# Patient Record
Sex: Male | Born: 1960 | Race: Black or African American | Hispanic: No | Marital: Single | Smoking: Never smoker
Health system: Southern US, Community
[De-identification: ages and names within clinical notes are randomized; demographics above are authoritative.]

## PROBLEM LIST (undated history)

## (undated) DIAGNOSIS — K219 Gastro-esophageal reflux disease without esophagitis: Secondary | ICD-10-CM

## (undated) DIAGNOSIS — I10 Essential (primary) hypertension: Secondary | ICD-10-CM

## (undated) DIAGNOSIS — E785 Hyperlipidemia, unspecified: Secondary | ICD-10-CM

## (undated) DIAGNOSIS — M1990 Unspecified osteoarthritis, unspecified site: Secondary | ICD-10-CM

## (undated) DIAGNOSIS — G4730 Sleep apnea, unspecified: Secondary | ICD-10-CM

## (undated) HISTORY — PX: DIALYSIS/PERMA CATHETER INSERTION: CATH118288

---

## 2002-05-30 ENCOUNTER — Inpatient Hospital Stay (HOSPITAL_COMMUNITY): Admission: EM | Admit: 2002-05-30 | Discharge: 2002-06-02 | Payer: Self-pay | Admitting: Psychiatry

## 2009-07-29 ENCOUNTER — Emergency Department (HOSPITAL_BASED_OUTPATIENT_CLINIC_OR_DEPARTMENT_OTHER): Admission: EM | Admit: 2009-07-29 | Discharge: 2009-07-29 | Payer: Self-pay | Admitting: Emergency Medicine

## 2010-01-13 ENCOUNTER — Emergency Department (HOSPITAL_BASED_OUTPATIENT_CLINIC_OR_DEPARTMENT_OTHER): Admission: EM | Admit: 2010-01-13 | Discharge: 2010-01-13 | Payer: Self-pay | Admitting: Emergency Medicine

## 2010-02-03 ENCOUNTER — Emergency Department (HOSPITAL_BASED_OUTPATIENT_CLINIC_OR_DEPARTMENT_OTHER): Admission: EM | Admit: 2010-02-03 | Discharge: 2010-02-04 | Payer: Self-pay | Admitting: Emergency Medicine

## 2010-03-18 ENCOUNTER — Emergency Department (HOSPITAL_BASED_OUTPATIENT_CLINIC_OR_DEPARTMENT_OTHER): Admission: EM | Admit: 2010-03-18 | Discharge: 2010-03-18 | Payer: Self-pay | Admitting: Emergency Medicine

## 2010-04-27 ENCOUNTER — Emergency Department (HOSPITAL_BASED_OUTPATIENT_CLINIC_OR_DEPARTMENT_OTHER): Admission: EM | Admit: 2010-04-27 | Discharge: 2010-04-27 | Payer: Self-pay | Admitting: Emergency Medicine

## 2010-05-08 ENCOUNTER — Emergency Department (HOSPITAL_BASED_OUTPATIENT_CLINIC_OR_DEPARTMENT_OTHER): Admission: EM | Admit: 2010-05-08 | Discharge: 2010-05-08 | Payer: Self-pay | Admitting: Emergency Medicine

## 2010-05-22 ENCOUNTER — Emergency Department (HOSPITAL_BASED_OUTPATIENT_CLINIC_OR_DEPARTMENT_OTHER): Admission: EM | Admit: 2010-05-22 | Discharge: 2010-05-22 | Payer: Self-pay | Admitting: Emergency Medicine

## 2010-06-15 ENCOUNTER — Emergency Department (HOSPITAL_BASED_OUTPATIENT_CLINIC_OR_DEPARTMENT_OTHER): Admission: EM | Admit: 2010-06-15 | Discharge: 2010-06-15 | Payer: Self-pay | Admitting: Emergency Medicine

## 2010-06-29 ENCOUNTER — Emergency Department (HOSPITAL_BASED_OUTPATIENT_CLINIC_OR_DEPARTMENT_OTHER): Admission: EM | Admit: 2010-06-29 | Discharge: 2010-06-29 | Payer: Self-pay | Admitting: Emergency Medicine

## 2010-07-24 ENCOUNTER — Emergency Department (HOSPITAL_BASED_OUTPATIENT_CLINIC_OR_DEPARTMENT_OTHER): Admission: EM | Admit: 2010-07-24 | Discharge: 2010-07-24 | Payer: Self-pay | Admitting: Emergency Medicine

## 2010-08-11 ENCOUNTER — Emergency Department (HOSPITAL_BASED_OUTPATIENT_CLINIC_OR_DEPARTMENT_OTHER): Admission: EM | Admit: 2010-08-11 | Discharge: 2010-08-11 | Payer: Self-pay | Admitting: Emergency Medicine

## 2010-08-31 ENCOUNTER — Emergency Department (HOSPITAL_BASED_OUTPATIENT_CLINIC_OR_DEPARTMENT_OTHER)
Admission: EM | Admit: 2010-08-31 | Discharge: 2010-08-31 | Payer: Self-pay | Source: Home / Self Care | Admitting: Emergency Medicine

## 2010-09-29 ENCOUNTER — Emergency Department (HOSPITAL_BASED_OUTPATIENT_CLINIC_OR_DEPARTMENT_OTHER)
Admission: EM | Admit: 2010-09-29 | Discharge: 2010-09-29 | Payer: Self-pay | Source: Home / Self Care | Admitting: Emergency Medicine

## 2010-10-21 ENCOUNTER — Ambulatory Visit (HOSPITAL_BASED_OUTPATIENT_CLINIC_OR_DEPARTMENT_OTHER): Admission: RE | Admit: 2010-10-21 | Payer: Self-pay | Source: Home / Self Care | Admitting: Family Medicine

## 2010-11-10 ENCOUNTER — Ambulatory Visit (HOSPITAL_BASED_OUTPATIENT_CLINIC_OR_DEPARTMENT_OTHER): Admission: RE | Admit: 2010-11-10 | Payer: Self-pay | Source: Home / Self Care

## 2010-12-09 ENCOUNTER — Ambulatory Visit (HOSPITAL_BASED_OUTPATIENT_CLINIC_OR_DEPARTMENT_OTHER): Payer: Self-pay

## 2010-12-21 LAB — CBC
HCT: 38.5 % — ABNORMAL LOW (ref 39.0–52.0)
Hemoglobin: 13 g/dL (ref 13.0–17.0)
MCH: 29.5 pg (ref 26.0–34.0)
MCHC: 33.8 g/dL (ref 30.0–36.0)
MCV: 87.5 fL (ref 78.0–100.0)
Platelets: 148 10*3/uL — ABNORMAL LOW (ref 150–400)
RBC: 4.4 MIL/uL (ref 4.22–5.81)
RDW: 12.4 % (ref 11.5–15.5)
WBC: 10.2 10*3/uL (ref 4.0–10.5)

## 2010-12-21 LAB — DIFFERENTIAL
Basophils Relative: 0 % (ref 0–1)
Eosinophils Relative: 2 % (ref 0–5)
Lymphocytes Relative: 11 % — ABNORMAL LOW (ref 12–46)
Monocytes Absolute: 0.5 10*3/uL (ref 0.1–1.0)
Monocytes Relative: 5 % (ref 3–12)
Neutro Abs: 8.4 10*3/uL — ABNORMAL HIGH (ref 1.7–7.7)

## 2010-12-21 LAB — BASIC METABOLIC PANEL
Chloride: 108 mEq/L (ref 96–112)
GFR calc non Af Amer: 43 mL/min — ABNORMAL LOW (ref >60)
Glucose, Bld: 147 mg/dL — ABNORMAL HIGH (ref 70–99)
Potassium: 3.8 mEq/L (ref 3.5–5.1)
Sodium: 142 mEq/L (ref 135–145)

## 2010-12-22 LAB — CBC
Hemoglobin: 13.9 g/dL (ref 13.0–17.0)
MCH: 31.5 pg (ref 26.0–34.0)
MCV: 90.8 fL (ref 78.0–100.0)
RBC: 4.41 MIL/uL (ref 4.22–5.81)

## 2010-12-22 LAB — DIFFERENTIAL
Eosinophils Absolute: 0.1 10*3/uL (ref 0.0–0.7)
Eosinophils Relative: 2 % (ref 0–5)
Lymphs Abs: 1.6 10*3/uL (ref 0.7–4.0)
Monocytes Absolute: 0.6 10*3/uL (ref 0.1–1.0)
Monocytes Relative: 6 % (ref 3–12)

## 2010-12-22 LAB — BASIC METABOLIC PANEL
CO2: 23 mEq/L (ref 19–32)
Chloride: 107 mEq/L (ref 96–112)
GFR calc Af Amer: 60 mL/min — ABNORMAL LOW (ref >60)
Sodium: 142 mEq/L (ref 135–145)

## 2010-12-24 LAB — GLUCOSE, CAPILLARY: Glucose-Capillary: 159 mg/dL — ABNORMAL HIGH (ref 70–99)

## 2010-12-24 LAB — CBC
Hemoglobin: 14.6 g/dL (ref 13.0–17.0)
MCH: 32.4 pg (ref 26.0–34.0)
MCHC: 33.5 g/dL (ref 30.0–36.0)
Platelets: 110 10*3/uL — ABNORMAL LOW (ref 150–400)

## 2010-12-24 LAB — BASIC METABOLIC PANEL
CO2: 29 mEq/L (ref 19–32)
Calcium: 8.9 mg/dL (ref 8.4–10.5)
Creatinine, Ser: 1.4 mg/dL (ref 0.4–1.5)
Glucose, Bld: 168 mg/dL — ABNORMAL HIGH (ref 70–99)

## 2011-02-26 NOTE — H&P (Signed)
NAME:  Jordan Williamson, Jordan Williamson                          ACCOUNT NO.:  [id number]   MEDICAL RECORD NO.:  [id number]                   PATIENT TYPE:  IPS   LOCATION:  0506                                 FACILITY:   PHYSICIAN:  Carlton Adam, MD                     DATE OF BIRTH:  Mar 04, 1961   DATE OF ADMISSION:  05/30/2002  DATE OF DISCHARGE:                         PSYCHIATRIC ADMISSION ASSESSMENT   IDENTIFYING INFORMATION:  Jordan Williamson 50 year old married African-American male,  voluntarily admitted on May 30, 2002.   HISTORY OF PRESENT ILLNESS:  The patient presents with Jordan Williamson history of alcohol  and cocaine abuse, drinking and doing drugs for the past 9 months.  He  states it started after he lost his job and being influenced by others.  He  reports he has been getting depressed, with suicidal thought, no specific  plan.  Having financial difficulties, owing people Jordan Williamson lot of money and seeing  his money going down the drain.  Reports decreased sleep with Jordan Williamson history of  sleep apnea.  Has decreased appetite, lost 18 pounds.  He denies any current  suicidal ideation, experiencing positive homicidal ideation towards those  people that got him fired from his last job.  He denies any withdrawal  symptoms at present.   PAST PSYCHIATRIC HISTORY:  First admission to [facility], no  other admission, no history of detox.   SOCIAL HISTORY:  He is Jordan Williamson 50 year old married African-American male.  He has  been married for 2 years, first marriage, 2 children ages 30 and 53.  He  lives with his wife and children.  He works in [employer].  No legal charges.  He did have Jordan Williamson DUI in 1995.   FAMILY HISTORY:  Unknown.   ALCOHOL DRUG HISTORY:  Nonsmoker.  Has been drinking every day up to 2  quarts of beer and 2 pints of wine for the past 8-9 months.  He states his  drinking has been escalating.  No history of blackouts or seizures.  Last  drink was on May 30, 2002.  His first drink was  at age 47.  Does not  drink at work.  He also has been using crack cocaine for the past 8 months.   PAST MEDICAL HISTORY:  Primary care Donnalynn Wheeless is Dr. Lajoyce Corners in [facility]  Medical problems are hypertension, sleep apnea, gout and GERD.   MEDICATIONS:  Lotensin 20 mg every day and Prilosec 20 mg every day.   DRUG ALLERGIES:  No known allergies.   PHYSICAL EXAMINATION:  VITAL SIGNS:  97.1, 73 heart rate, respiratory rate is 22.  Blood pressure  is 176/96.  He is 5 feet 9 inches tall.  He is 232 pounds.  GENERAL APPEARANCE:  The patient  is Jordan Williamson 50 year old  African-American male in  no acute distress.  Well developed.  Patient is well groomed.  HEAD:  Atraumatic, normocephalic.  Can raise her eyebrows.  Pupils are equal  and reactive.  His funduscopic exam is within normal limits.  External ear  canals are patent.  Hearing is appropriate to conversation.  Nostrils are  patent bilaterally.  No sinus tenderness. Turbinates are normal.  Mucosa is  moist with good dentition.  No lesions were seen.  Gingival is normal.  Tongue protrudes midline without tremor.  Can clench his teeth and puff out  his cheeks.  Uvula is midline.  No pharyngeal exudate.  Gag reflex is  intact.  NECK:  Supple, no JVD, negative lymphadenopathy.  Thyroid is nonpalpable and  nontender.  Trachea is midline.  CHEST:  Clear to auscultation.  No adventitious sounds.  Thorax is  symmetrical with good expansion.  HEART:  Regular rate and rhythm, without murmurs, gallops or rubs.  Carotid  pulses are equal and adequate.  Pedal pulses are equal and adequate.  No  edema or varicosities noted.  ABDOMEN:  Soft, nontender abdomen.  Normoactive bowel sounds.  No CVA  tenderness.  MUSCULOSKELETAL:  No joint swelling or deformity.  Good range of motion.  Muscle strength and tone is equal bilaterally.  No signs of injury.  SKIN:  Warm and dry with good turgor.  Nail beds are pink with good  capillary refill.  No clubbing.  No rashes  or lacerations.  NEUROLOGIC:  Oriented x3.  Cranial nerves are grossly intact.  Deep tendon  reflexes are 4+, equal and adequate.  Good grip strength bilaterally.  No  involuntary movements.  Gait is normal.  Cerebellar function intact, with  finger to finger.  Romberg is negative.   LABORATORY DATA:  Alcohol level was 23.  CBC is within normal limits.  CMET:  Serum  blood sugar mildly elevated at 131.   MENTAL STATUS EXAM:  He is an alert, overweight, cooperative male, casually  dressed, polite, with good eye contact.  Speech is clear.  Mood is  depressed, affect is flat.  Thought processes are coherent.  No suicidal or  homicidal ideation.  No auditory or visual hallucinations.  Cognitive  function intact.  Memory is fair, judgment is fair, insight is fair, poor  impulse control.   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  2. Alcohol abuse rule out dependence.  3. Cocaine abuse rule out dependence.   AXIS II:  Deferred.   AXIS III:  Hypertension, hyperlipidemia.   AXIS IV:  Problems with primary support group, other psychosocial problems  related to alcohol and drug use.   AXIS V:  Current is 30, this past year 36.   PLAN:  Voluntary admission for depression, suicidal ideation, alcohol and  cocaine abuse.  Contract for safety, check every 15 minutes.  Phenobarbital  protocol will be initiated to detox safely.  Encourage fluids.  Antidepressants were discussed.  The patient feels he does not need them at  this time.  We will continue to monitor depressive  symptoms during patient's hospitalization.  The patient to attend groups,  follow up with mental health and AA meetings to increase coping skills and  to prevent relapse.   TENTATIVE LENGTH OF CARE:  Three to five days.      Redgie Grayer, NP                         Carlton Adam, MD    JO/MEDQ  D:  06/01/2002  T:  06/02/2002  Job:  [id number]

## 2011-02-26 NOTE — Discharge Summary (Signed)
NAME:  Jordan Williamson, Jordan Williamson                          ACCOUNT NO.:  [id number]   MEDICAL RECORD NO.:  [id number]                   PATIENT TYPE:  IPS   LOCATION:  0506                                 FACILITY:  [facility]   PHYSICIAN:  Carlton Adam, M.D.                   DATE OF BIRTH:  11/12/1960   DATE OF ADMISSION:  05/30/2002  DATE OF DISCHARGE:  06/02/2002                                 DISCHARGE SUMMARY   CHIEF COMPLAINT AND PRESENT ILLNESS:  This was the first admission to [facility] for this 50 year old African-American male  voluntarily admitted.  History of alcohol and cocaine, drinking and doing  drugs for the past nine months.  Started after he lost his job and being  influenced by others.  Has been getting depressed, suicidal thoughts, no  specific plan.  Having financial difficulties, owing people a lot of money.  Sees his money going down the drain.  She reports decreased sleep, history  of sleep apnea.  Has decreased appetite.  Denies any current suicidal  ideation, experiencing positive homicidal ideation towards those people that  got him fired from his last job.  No withdrawal symptoms.   PAST PSYCHIATRIC HISTORY:  First time at [facility].  No  previous detox.   ALCOHOL/DRUG HISTORY:  Drinking every day up to two quarts of beer and two  pints of wine for the past 8-9 months.  His drinking has been escalating.  No history of blackouts or seizures.  Last drink was May 30, 2002.  First  drink was at age 73.   PAST MEDICAL HISTORY:  Hypertension, sleep apnea, gout, gastroesophageal  reflux.   MEDICATIONS:  Lotensin 20 mg every day, Prilosec 20 mg every day.   PHYSICAL EXAMINATION:  Performed and failed to show any acute findings.   MENTAL STATUS EXAM:  Alert, overweight, cooperative male, casually dressed,  polite.  Good eye contact.  Speech is clear.  Mood is depressed.  Affect is  flat.  Thought processes are coherent.  No suicidal or  homicidal ideation.  No auditory or visual hallucinations.  Cognition well-preserved.   ADMISSION DIAGNOSES:   AXIS I:  1. Alcohol and cocaine dependence.  2. Depressive disorder not otherwise specified.   AXIS II:  No diagnosis.   AXIS III:  1. Hypertension.  2. Hyperlipidemia.   AXIS IV:  Moderate.   AXIS V:  Global Assessment of Functioning upon admission 30; highest Global  Assessment of Functioning in the last year 32.   LABORATORY DATA:  CBC was within normal limits.  Blood chemistries were  within normal limits.  Thyroid profile was within normal limits.   HOSPITAL COURSE:  He was admitted and started intensive individual and group  psychotherapy.  He was detoxified initially using phenobarbital but this was  discontinued due to extreme sedation.  He was continued on the Prilosec  and  the Lotensin.  He settled down.  He was in full contact with reality.  No  suicidal ideation.  No homicidal ideation.  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  He could not afford to stay.  There was  contact with the family.  He was willing to pursue further outpatient  treatment and family felt that he was safe to do that.  No suicidal  ideation.  No homicidal ideation.  Fully detoxed.   DISCHARGE DIAGNOSES:   AXIS I:  Alcohol and cocaine dependence.   AXIS II:  No diagnosis.   AXIS III:  1. Hypertension.  2. Hyperlipidemia.   AXIS IV:  Moderate.   AXIS V:  Global Assessment of Functioning upon discharge 55-60.   DISCHARGE MEDICATIONS:  1. Protonix 40 mg daily.  2. Lotensin 20 mg daily.  3. Trazodone for sleep.   FOLLOW UP:  Will come to [facility] for further treatment.                                                Carlton Adam, M.D.    IL/MEDQ  D:  07/04/2002  T:  07/06/2002  Job:  [id number]

## 2011-06-04 ENCOUNTER — Encounter: Payer: Self-pay | Admitting: *Deleted

## 2011-06-04 ENCOUNTER — Emergency Department (HOSPITAL_BASED_OUTPATIENT_CLINIC_OR_DEPARTMENT_OTHER)
Admission: EM | Admit: 2011-06-04 | Discharge: 2011-06-04 | Disposition: A | Discharge status: Home / Self Care | Payer: Self-pay | Attending: Emergency Medicine | Admitting: Emergency Medicine

## 2011-06-04 DIAGNOSIS — E1149 Type 2 diabetes mellitus with other diabetic neurological complication: Secondary | ICD-10-CM | POA: Insufficient documentation

## 2011-06-04 DIAGNOSIS — E785 Hyperlipidemia, unspecified: Secondary | ICD-10-CM | POA: Insufficient documentation

## 2011-06-04 DIAGNOSIS — R21 Rash and other nonspecific skin eruption: Secondary | ICD-10-CM | POA: Insufficient documentation

## 2011-06-04 DIAGNOSIS — E1142 Type 2 diabetes mellitus with diabetic polyneuropathy: Secondary | ICD-10-CM | POA: Insufficient documentation

## 2011-06-04 DIAGNOSIS — I10 Essential (primary) hypertension: Secondary | ICD-10-CM | POA: Insufficient documentation

## 2011-06-04 DIAGNOSIS — G4730 Sleep apnea, unspecified: Secondary | ICD-10-CM | POA: Insufficient documentation

## 2011-06-04 DIAGNOSIS — Z79899 Other long term (current) drug therapy: Secondary | ICD-10-CM | POA: Insufficient documentation

## 2011-06-04 DIAGNOSIS — E1140 Type 2 diabetes mellitus with diabetic neuropathy, unspecified: Secondary | ICD-10-CM

## 2011-06-04 HISTORY — DX: Sleep apnea, unspecified: G47.30

## 2011-06-04 HISTORY — DX: Hyperlipidemia, unspecified: E78.5

## 2011-06-04 HISTORY — DX: Essential (primary) hypertension: I10

## 2011-06-04 LAB — GLUCOSE, CAPILLARY: Glucose-Capillary: 235 mg/dL — ABNORMAL HIGH (ref 70–99)

## 2011-06-04 MED ORDER — HYDROMORPHONE HCL 2 MG/ML IJ SOLN
2.0000 mg | Freq: Once | INTRAMUSCULAR | Status: AC
  Administered 2011-06-04: 2 mg via INTRAMUSCULAR
  Filled 2011-06-04: qty 1

## 2011-06-04 MED ORDER — HYDROCODONE-ACETAMINOPHEN 5-500 MG PO TABS: 1.0000 | ORAL_TABLET | Freq: Four times a day (QID) | ORAL | Status: AC | PRN

## 2011-06-04 MED ORDER — GABAPENTIN 300 MG PO CAPS: 600.0000 mg | ORAL_CAPSULE | Freq: Three times a day (TID) | ORAL | Status: DC

## 2011-06-04 NOTE — ED Notes (Signed)
Pt c/o bilat feet swelling and rash x3-4 days. Pt sts same itches and burns.

## 2011-06-04 NOTE — ED Provider Notes (Signed)
History     CSN: [id number] Arrival date & time: 06/04/2011  3:42 PM  Chief Complaint  Patient presents with  . Foot Swelling   HPI Comments: Complains of bilateral foot swelling, burning pain, rash for the past 5-6 days.  States sx have been present for longer but has gotten worse in past 5-6 days.  No known injury.  Pain is described as burning and needle sensations to the bottom of both feet.  Has hx of Dm.  Has been taking neurontin with slight improvement.  On metformin and does not take CBG at home.  Has been eating larger amount of sweets.  Rash is non-blanching red rash to medial aspect foot bilaterally and has not had any associated vesicles.  Denies cp, sob, palps, abd pain, HA, weakness.  States that he is not on his feet a large portion of the day  The history is provided by the patient. No language interpreter was used.    Past Medical History  Diagnosis Date  . Hypertension   . Diabetes mellitus   . Sleep apnea   . Gout   . Hyperlipidemia     History reviewed. No pertinent past surgical history.  No family history on file.  History  Substance Use Topics  . Smoking status: Never Smoker   . Smokeless tobacco: Not on file  . Alcohol Use: Yes      Review of Systems  Constitutional: Negative for fever, activity change, appetite change and fatigue.  HENT: Negative for congestion, sore throat, neck pain and neck stiffness.   Eyes: Negative for pain, redness and visual disturbance.  Respiratory: Negative for cough, chest tightness and shortness of breath.   Cardiovascular: Negative for chest pain and palpitations.  Gastrointestinal: Negative for nausea, vomiting, abdominal pain and diarrhea.  Genitourinary: Negative for dysuria, urgency, frequency and flank pain.  Musculoskeletal: Negative for back pain and gait problem.  Skin: Positive for rash. Negative for wound.  Neurological: Negative for dizziness, weakness, light-headedness, numbness and headaches.  All  other systems reviewed and are negative.    Physical Exam  BP 162/107  Pulse 82  Temp(Src) 97.5 F (36.4 C) (Oral)  Resp 20  SpO2 99%  Physical Exam  Nursing note and vitals reviewed. Constitutional: He is oriented to person, place, and time. He appears well-developed and well-nourished. No distress.  HENT:  Head: Normocephalic and atraumatic.  Mouth/Throat: Oropharynx is clear and moist.  Eyes: Conjunctivae and EOM are normal. Pupils are equal, round, and reactive to light.  Neck: Normal range of motion. Neck supple.  Cardiovascular: Normal rate, regular rhythm, normal heart sounds and intact distal pulses.  Exam reveals no gallop and no friction rub.   No murmur heard. Pulmonary/Chest: Effort normal and breath sounds normal. No respiratory distress. He exhibits no tenderness.  Abdominal: Soft. Bowel sounds are normal. There is no tenderness.  Musculoskeletal: Normal range of motion. He exhibits tenderness (plantar surface feet bilaterally - exacerbated by palpation and present over entire plantar surface bilaterally).  Lymphadenopathy:    He has no cervical adenopathy.  Neurological: He is alert and oriented to person, place, and time. No cranial nerve deficit.  Skin: Skin is warm and dry.       Non-blanching erythematous lesions to feet bilaterally.  No pain associated.  No lesions to the feet.  No aspects cellulitis    ED Course  Procedures  MDM 1. Diabetic neuropathy 2. Rash  The patient's pain is consistent with a chronic pain he has  been experiencing secondary to his diabetes. The pain is feet consistent with diabetic neuropathy. He has been taking Neurontin twice daily. I recommended that he increase the Neurontin to 600 mg 3 times daily until he sees his primary physician next week. I also prescribed a short course of pain medication. Blood glucose was 230 in the emergency department. He is instructed to continue his metformin as directed. He has increased to sugar and  put in the past few days I have encouraged him to go back to his normal diet. There is no concerns for infection or shingles as a cause of his pain rash. I feel that his rash is more chronic in nature than acute. There may be a petechial component but it is limited only to his feet. He received a shot of Dilaudid emergency department for pain control and will be discharged home with instructions to followup with his primary care physician this week. I prescribed Neurontin and Vicodin for pain.      Trisha Mangle, MD 06/04/11 [phone]

## 2011-06-04 NOTE — ED Notes (Signed)
rx x 2 for vicodin and neurontin given at d/c- pt has called his sister to drive him home

## 2011-06-28 ENCOUNTER — Encounter (HOSPITAL_BASED_OUTPATIENT_CLINIC_OR_DEPARTMENT_OTHER): Payer: Self-pay | Admitting: Family Medicine

## 2011-06-28 ENCOUNTER — Emergency Department (HOSPITAL_BASED_OUTPATIENT_CLINIC_OR_DEPARTMENT_OTHER)
Admission: EM | Admit: 2011-06-28 | Discharge: 2011-06-28 | Disposition: A | Discharge status: Home / Self Care | Payer: Self-pay | Attending: Emergency Medicine | Admitting: Emergency Medicine

## 2011-06-28 DIAGNOSIS — G4730 Sleep apnea, unspecified: Secondary | ICD-10-CM | POA: Insufficient documentation

## 2011-06-28 DIAGNOSIS — R209 Unspecified disturbances of skin sensation: Secondary | ICD-10-CM | POA: Insufficient documentation

## 2011-06-28 DIAGNOSIS — E1149 Type 2 diabetes mellitus with other diabetic neurological complication: Secondary | ICD-10-CM | POA: Insufficient documentation

## 2011-06-28 DIAGNOSIS — I10 Essential (primary) hypertension: Secondary | ICD-10-CM | POA: Insufficient documentation

## 2011-06-28 DIAGNOSIS — E1142 Type 2 diabetes mellitus with diabetic polyneuropathy: Secondary | ICD-10-CM | POA: Insufficient documentation

## 2011-06-28 DIAGNOSIS — Z79899 Other long term (current) drug therapy: Secondary | ICD-10-CM | POA: Insufficient documentation

## 2011-06-28 DIAGNOSIS — E1140 Type 2 diabetes mellitus with diabetic neuropathy, unspecified: Secondary | ICD-10-CM

## 2011-06-28 DIAGNOSIS — E785 Hyperlipidemia, unspecified: Secondary | ICD-10-CM | POA: Insufficient documentation

## 2011-06-28 MED ORDER — OXYCODONE-ACETAMINOPHEN 5-325 MG PO TABS: 2.0000 | ORAL_TABLET | ORAL | Status: AC | PRN

## 2011-06-28 MED ORDER — OXYCODONE-ACETAMINOPHEN 5-325 MG PO TABS
2.0000 | ORAL_TABLET | Freq: Once | ORAL | Status: AC
  Administered 2011-06-28: 2 via ORAL
  Filled 2011-06-28: qty 2

## 2011-06-28 NOTE — ED Provider Notes (Signed)
History     CSN: [id number] Arrival date & time: 06/28/2011  5:49 PM   Chief Complaint  Patient presents with  . Foot Pain     (Include location/radiation/quality/duration/timing/severity/associated sxs/prior treatment) HPI Comments: Pt states that he has burning and tingling on the bottom on his feet:pt states that he has a rash to both feet but that is nothing new:pt states that he has a history of gout:pt states that he was seen here a couple of days ago and he would like another shot like they gave him because that helped with the pain right away  Patient is a 50 y.o. male presenting with lower extremity pain. The history is provided by the patient. No language interpreter was used.  Foot Pain This is a recurrent problem. The current episode started 1 to 4 weeks ago. The problem occurs intermittently. The problem has been unchanged. The symptoms are aggravated by nothing.     Past Medical History  Diagnosis Date  . Hypertension   . Diabetes mellitus   . Sleep apnea   . Gout   . Hyperlipidemia      History reviewed. No pertinent past surgical history.  No family history on file.  History  Substance Use Topics  . Smoking status: Never Smoker   . Smokeless tobacco: Not on file  . Alcohol Use: Yes      Review of Systems  Respiratory: Negative.   Cardiovascular: Negative.   Skin: Positive for pallor.  Neurological:       Tingling and burning in feet bilaterally    Allergies  Review of patient's allergies indicates no known allergies.  Home Medications   Current Outpatient Rx  Name Route Sig Dispense Refill  . ALLOPURINOL 100 MG PO TABS Oral Take 200 mg by mouth daily.      Marland Kitchen AMLODIPINE-OLMESARTAN 10-40 MG PO TABS Oral Take 1 tablet by mouth daily.      . ASPIRIN 81 MG PO TABS Oral Take 81 mg by mouth daily.      Marland Kitchen CLONIDINE HCL 0.3 MG PO TABS Oral Take 0.3 mg by mouth 2 (two) times daily.      . COLCHICINE 0.6 MG PO TABS Oral Take 0.6 mg by mouth daily.       Marland Kitchen ESOMEPRAZOLE MAGNESIUM 40 MG PO CPDR Oral Take 40 mg by mouth daily before breakfast.      . GABAPENTIN 300 MG PO CAPS Oral Take 300 mg by mouth 2 (two) times daily.      Marland Kitchen GABAPENTIN 300 MG PO CAPS Oral Take 2 capsules (600 mg total) by mouth 3 (three) times daily. 30 capsule 0  . METFORMIN HCL 500 MG PO TABS Oral Take 1,000 mg by mouth 2 (two) times daily with a meal.      . NEBIVOLOL HCL 20 MG PO TABS Oral Take 20 mg by mouth daily.      Marland Kitchen PRAVASTATIN SODIUM 10 MG PO TABS Oral Take 10 mg by mouth daily.        Physical Exam    BP 161/108  Pulse 81  Temp(Src) 97.9 F (36.6 C) (Oral)  Resp 18  Ht 5\' 9"  (1.753 m)  Wt 230 lb (104.327 kg)  BMI 33.96 kg/m2  SpO2 97%  Physical Exam  Nursing note and vitals reviewed. Constitutional: He is oriented to person, place, and time. He appears well-developed and well-nourished.  HENT:  Head: Normocephalic.  Cardiovascular: Normal rate and regular rhythm.   Pulmonary/Chest: Effort normal and  breath sounds normal.  Musculoskeletal: Normal range of motion.       Pt has non blanching rash to top of feet bilaterally  Neurological: He is alert and oriented to person, place, and time.  Skin:       Mild redness noted to the plantar aspect of bilateral feet    ED Course  Procedures  Results for orders placed during the hospital encounter of 06/28/11  GLUCOSE, CAPILLARY      Component Value Range   Glucose-Capillary 110 (*) 70 - 99 (mg/dL)   No results found.   No diagnosis found.   MDM Diabetic neuropathy vs gout:will give pt pain medication and encourage the increased dose of neurotin:pt has colchicine at home       Glendell Docker, NP 06/28/11 [id number]

## 2011-06-28 NOTE — ED Notes (Signed)
Pt c/o "feet burning and I have red spots on them". Pt reports h/o gout.

## 2011-06-28 NOTE — ED Provider Notes (Signed)
Medical screening examination/treatment/procedure(s) were performed by non-physician practitioner and as supervising physician I was immediately available for consultation/collaboration.   Ezequiel Essex, MD 06/28/11 [phone]

## 2011-06-29 ENCOUNTER — Encounter (HOSPITAL_BASED_OUTPATIENT_CLINIC_OR_DEPARTMENT_OTHER): Payer: Self-pay | Admitting: *Deleted

## 2011-07-05 ENCOUNTER — Encounter (HOSPITAL_BASED_OUTPATIENT_CLINIC_OR_DEPARTMENT_OTHER): Payer: Self-pay

## 2011-07-05 ENCOUNTER — Emergency Department (HOSPITAL_BASED_OUTPATIENT_CLINIC_OR_DEPARTMENT_OTHER)
Admission: EM | Admit: 2011-07-05 | Discharge: 2011-07-05 | Disposition: A | Discharge status: Home / Self Care | Payer: Self-pay | Attending: Emergency Medicine | Admitting: Emergency Medicine

## 2011-07-05 DIAGNOSIS — Z79899 Other long term (current) drug therapy: Secondary | ICD-10-CM | POA: Insufficient documentation

## 2011-07-05 DIAGNOSIS — E1140 Type 2 diabetes mellitus with diabetic neuropathy, unspecified: Secondary | ICD-10-CM

## 2011-07-05 DIAGNOSIS — E785 Hyperlipidemia, unspecified: Secondary | ICD-10-CM | POA: Insufficient documentation

## 2011-07-05 DIAGNOSIS — E1142 Type 2 diabetes mellitus with diabetic polyneuropathy: Secondary | ICD-10-CM | POA: Insufficient documentation

## 2011-07-05 DIAGNOSIS — I10 Essential (primary) hypertension: Secondary | ICD-10-CM | POA: Insufficient documentation

## 2011-07-05 DIAGNOSIS — E1149 Type 2 diabetes mellitus with other diabetic neurological complication: Secondary | ICD-10-CM | POA: Insufficient documentation

## 2011-07-05 DIAGNOSIS — M7989 Other specified soft tissue disorders: Secondary | ICD-10-CM | POA: Insufficient documentation

## 2011-07-05 DIAGNOSIS — G4730 Sleep apnea, unspecified: Secondary | ICD-10-CM | POA: Insufficient documentation

## 2011-07-05 LAB — GLUCOSE, CAPILLARY: Glucose-Capillary: 120 mg/dL — ABNORMAL HIGH (ref 70–99)

## 2011-07-05 NOTE — ED Provider Notes (Signed)
History    Scribed for Shaune Pollack, MD, the patient was seen in room MH04/MH04. This chart was scribed by Lyndee Hensen. This patient's care was started at 7:49 PM.     CSN: [id number] Arrival date & time: 07/05/2011  7:24 PM  Chief Complaint  Patient presents with  . Foot Swelling    HPI  (Consider location/radiation/quality/duration/timing/severity/associated sxs/prior treatment)  HPI  Jordan Williamson is a 50 y.o. male who presents to the Emergency Department complaining of gradual worsening of ongoing diabetic neuropathy in feet.  Patient states that his feet are erythematous with "burning, pins and needle" pain that has gotten worse about a month ago.  Denies irregular blood sugars, fever, SOB, chest pain, dysuria, abdominal pain and weakness.  Patient has hx of gout but states that sx are not the same.  Patient has appointment at [facility] on 07/09/11.  Patient has hx of DM, gout, HTN, and hyperlipidemia.     PAST MEDICAL HISTORY:  Past Medical History  Diagnosis Date  . Hypertension   . Diabetes mellitus   . Sleep apnea   . Gout   . Hyperlipidemia     PAST SURGICAL HISTORY:  History reviewed. No pertinent past surgical history.  FAMILY HISTORY:  No family history on file.   SOCIAL HISTORY: History   Social History  . Marital Status: Single    Spouse Name: N/A    Number of Children: N/A  . Years of Education: N/A   Social History Main Topics  . Smoking status: Never Smoker   . Smokeless tobacco: None  . Alcohol Use: Yes  . Drug Use: No  . Sexually Active:    Other Topics Concern  . None   Social History Narrative  . None     Review of Systems  Review of Systems 10 Systems reviewed and are negative for acute change except as noted in the HPI.  Allergies  Review of patient's allergies indicates no known allergies.  Home Medications   Current Outpatient Rx  Name Route Sig Dispense Refill  . ALLOPURINOL 100 MG PO TABS Oral Take 200  mg by mouth daily.      Marland Kitchen AMLODIPINE-OLMESARTAN 10-40 MG PO TABS Oral Take 1 tablet by mouth daily.      . ASPIRIN 81 MG PO TABS Oral Take 81 mg by mouth daily.      Marland Kitchen CLONIDINE HCL 0.3 MG PO TABS Oral Take 0.3 mg by mouth 2 (two) times daily.      . COLCHICINE 0.6 MG PO TABS Oral Take 0.6 mg by mouth daily.      Marland Kitchen ESOMEPRAZOLE MAGNESIUM 40 MG PO CPDR Oral Take 40 mg by mouth daily before breakfast.      . GABAPENTIN 300 MG PO CAPS Oral Take 2 capsules (600 mg total) by mouth 3 (three) times daily. 30 capsule 0  . METFORMIN HCL 500 MG PO TABS Oral Take 1,000 mg by mouth 2 (two) times daily with a meal.      . NEBIVOLOL HCL 20 MG PO TABS Oral Take 20 mg by mouth daily.      . OXYCODONE-ACETAMINOPHEN 5-325 MG PO TABS Oral Take 2 tablets by mouth every 4 (four) hours as needed for pain. 15 tablet 0  . PRAVASTATIN SODIUM 10 MG PO TABS Oral Take 10 mg by mouth daily.      Marland Kitchen GABAPENTIN 300 MG PO CAPS Oral Take 300 mg by mouth 2 (two) times daily.  Physical Exam    BP 155/106  Pulse 73  Temp(Src) 97.7 F (36.5 C) (Oral)  Resp 20  Ht 5\' 9"  (1.753 m)  Wt 230 lb (104.327 kg)  BMI 33.96 kg/m2  SpO2 98%  Physical Exam  Nursing note and vitals reviewed. Constitutional: He is oriented to person, place, and time. He appears well-developed and well-nourished.  HENT:  Head: Normocephalic and atraumatic.  Eyes: EOM are normal. Pupils are equal, round, and reactive to light.  Neck: Neck supple.  Cardiovascular: Normal rate, regular rhythm and intact distal pulses.   Pulses:      Dorsalis pedis pulses are 2+ on the right side, and 2+ on the left side.  Pulmonary/Chest: Effort normal. No respiratory distress.  Abdominal: Soft. There is no tenderness.  Musculoskeletal: Normal range of motion.       EqualTrace edema bilaterally, No pain with sensation.    Neurological: He is alert and oriented to person, place, and time.  Skin: Skin is warm and dry. No erythema.  Psychiatric: He has a normal  mood and affect. His behavior is normal.    ED Course  Procedures (including critical care time) OTHER DATA REVIEWED: Nursing notes, vital signs, and past medical records reviewed.   DIAGNOSTIC STUDIES: Oxygen Saturation is 98% on room air, normal by my interpretation.       LABS / RADIOLOGY:  Results for orders placed during the hospital encounter of 07/05/11  GLUCOSE, CAPILLARY      Component Value Range   Glucose-Capillary 120 (*) 70 - 99 (mg/dL)   Comment 1 Notify RN     Comment 2 Documented in Chart       No results found.    ED COURSE / COORDINATION OF CARE: 8:00 PM  Physical exam complete.    Orders Placed This Encounter  Procedures  . Glucose, capillary    MDM:  Patient is to follow up with PCP.  Check patient's blood sugar.  Will review previous visit notes concerning diabetic neuropathy.    IMPRESSION: Diagnoses that have been ruled out:  Diagnoses that are still under consideration:  Final diagnoses:  Diabetic neuropathy     MEDICATIONS GIVEN IN THE E.D. Scheduled Meds:   Continuous Infusions:      DISCHARGE MEDICATIONS: New Prescriptions   No medications on file    I personally performed the services described in this documentation, which was scribed in my presence. The recorded information has been reviewed and considered. Shaune Pollack, MD           Shaune Pollack, MD 07/07/11 [phone]

## 2011-07-05 NOTE — ED Notes (Signed)
C/o redness,swelling to both feet x 1 month

## 2011-07-23 ENCOUNTER — Emergency Department (INDEPENDENT_AMBULATORY_CARE_PROVIDER_SITE_OTHER): Payer: Self-pay

## 2011-07-23 ENCOUNTER — Emergency Department (HOSPITAL_BASED_OUTPATIENT_CLINIC_OR_DEPARTMENT_OTHER)
Admission: EM | Admit: 2011-07-23 | Discharge: 2011-07-23 | Disposition: A | Discharge status: Other Acute Inpatient Hospital | Payer: Self-pay | Attending: Emergency Medicine | Admitting: Emergency Medicine

## 2011-07-23 ENCOUNTER — Encounter (HOSPITAL_BASED_OUTPATIENT_CLINIC_OR_DEPARTMENT_OTHER): Payer: Self-pay

## 2011-07-23 ENCOUNTER — Other Ambulatory Visit: Payer: Self-pay

## 2011-07-23 DIAGNOSIS — R51 Headache: Secondary | ICD-10-CM

## 2011-07-23 DIAGNOSIS — I10 Essential (primary) hypertension: Secondary | ICD-10-CM | POA: Insufficient documentation

## 2011-07-23 DIAGNOSIS — G4730 Sleep apnea, unspecified: Secondary | ICD-10-CM | POA: Insufficient documentation

## 2011-07-23 DIAGNOSIS — E785 Hyperlipidemia, unspecified: Secondary | ICD-10-CM | POA: Insufficient documentation

## 2011-07-23 DIAGNOSIS — R079 Chest pain, unspecified: Secondary | ICD-10-CM | POA: Insufficient documentation

## 2011-07-23 DIAGNOSIS — E119 Type 2 diabetes mellitus without complications: Secondary | ICD-10-CM | POA: Insufficient documentation

## 2011-07-23 DIAGNOSIS — Z79899 Other long term (current) drug therapy: Secondary | ICD-10-CM | POA: Insufficient documentation

## 2011-07-23 LAB — URINALYSIS, ROUTINE W REFLEX MICROSCOPIC
Bilirubin Urine: NEGATIVE
Glucose, UA: NEGATIVE mg/dL
Hgb urine dipstick: NEGATIVE
Ketones, ur: NEGATIVE mg/dL
Leukocytes, UA: NEGATIVE
Nitrite: NEGATIVE
Protein, ur: NEGATIVE mg/dL
Specific Gravity, Urine: 1.009 (ref 1.005–1.030)
Urobilinogen, UA: 0.2 mg/dL (ref 0.0–1.0)
pH: 5 (ref 5.0–8.0)

## 2011-07-23 LAB — BASIC METABOLIC PANEL
BUN: 17 mg/dL (ref 6–23)
CO2: 24 mEq/L (ref 19–32)
Calcium: 9.3 mg/dL (ref 8.4–10.5)
Chloride: 101 mEq/L (ref 96–112)
Creatinine, Ser: 1.4 mg/dL — ABNORMAL HIGH (ref 0.50–1.35)
GFR calc Af Amer: 66 mL/min — ABNORMAL LOW (ref >90)
GFR calc non Af Amer: 57 mL/min — ABNORMAL LOW (ref >90)
Glucose, Bld: 249 mg/dL — ABNORMAL HIGH (ref 70–99)
Potassium: 3.5 mEq/L (ref 3.5–5.1)
Sodium: 137 mEq/L (ref 135–145)

## 2011-07-23 LAB — CBC
HCT: 42.6 % (ref 39.0–52.0)
Hemoglobin: 14.7 g/dL (ref 13.0–17.0)
MCH: 31.6 pg (ref 26.0–34.0)
MCHC: 34.5 g/dL (ref 30.0–36.0)
MCV: 91.6 fL (ref 78.0–100.0)
Platelets: 165 10*3/uL (ref 150–400)
RBC: 4.65 MIL/uL (ref 4.22–5.81)
RDW: 12.1 % (ref 11.5–15.5)
WBC: 7.3 10*3/uL (ref 4.0–10.5)

## 2011-07-23 LAB — CARDIAC PANEL(CRET KIN+CKTOT+MB+TROPI)
CK, MB: 3.8 ng/mL (ref 0.3–4.0)
Relative Index: 2.1 (ref 0.0–2.5)
Total CK: 181 U/L (ref 7–232)
Troponin I: <0.3 ng/mL (ref <0.30)

## 2011-07-23 MED ORDER — ASPIRIN 81 MG PO CHEW
324.0000 mg | CHEWABLE_TABLET | Freq: Once | ORAL | Status: AC
Start: 2011-07-23 — End: 2011-07-23
  Administered 2011-07-23: 324 mg via ORAL
  Filled 2011-07-23: qty 4

## 2011-07-23 MED ORDER — LABETALOL HCL 5 MG/ML IV SOLN
INTRAVENOUS | Status: AC
  Administered 2011-07-23: 20 mg via INTRAVENOUS
  Filled 2011-07-23: qty 4

## 2011-07-23 MED ORDER — LABETALOL HCL 5 MG/ML IV SOLN
20.0000 mg | Freq: Once | INTRAVENOUS | Status: AC
  Administered 2011-07-23: 20 mg via INTRAVENOUS

## 2011-07-23 NOTE — ED Notes (Signed)
Wanda-sister requested to be contact person for pt.  Number is [phone]

## 2011-07-23 NOTE — ED Notes (Signed)
PO fluids provided to pt. Unable to void at present time.

## 2011-07-23 NOTE — ED Notes (Signed)
Sent to ED from PMD office  for further evaluation.  Elevated blood pressure and chest pain.

## 2011-07-23 NOTE — ED Notes (Signed)
[facility] in ED for transfer.  Pt transported to [facility] and stable upon d/c from ED.  Peripheral IV documented per protocol as d/c'd but actually left intact for transfer.

## 2011-07-23 NOTE — ED Provider Notes (Signed)
History   50yM with CP. Has been having intermittently for several weeks. Lasts from several minutes to hours. Has had both at rest and with exertion. Sometimes associated with SOB. No fever or chills. No n/v. Hx of poorly controlled HTN and also DM. Says compliant with meds but has not had consistent f/u. No unusual swelling. Denies hx of blood clot. No n/v. Denies acute back pain. CSN: [id number] Arrival date & time: 07/23/2011  1:03 PM  Chief Complaint  Patient presents with  . Hypertension  . Chest Pain    (Consider location/radiation/quality/duration/timing/severity/associated sxs/prior treatment) The history is provided by the patient.    Past Medical History  Diagnosis Date  . Hypertension   . Diabetes mellitus   . Sleep apnea   . Gout   . Hyperlipidemia   . Gout   . Hyperlipidemia     History reviewed. No pertinent past surgical history.  No family history on file.  History  Substance Use Topics  . Smoking status: Never Smoker   . Smokeless tobacco: Never Used  . Alcohol Use: Yes     occasional      Review of Systems  All other systems reviewed and are negative.    Allergies  Review of patient's allergies indicates no known allergies.  Home Medications   Current Outpatient Rx  Name Route Sig Dispense Refill  . FUROSEMIDE 40 MG PO TABS Oral Take 40 mg by mouth 2 (two) times daily.      Marland Kitchen GLIPIZIDE 5 MG PO TABS Oral Take 5 mg by mouth 2 (two) times daily before a meal.      . LABETALOL HCL 100 MG PO TABS Oral Take 100 mg by mouth 2 (two) times daily.      Marland Kitchen METFORMIN HCL 1000 MG PO TABS Oral Take 1,000 mg by mouth 2 (two) times daily with a meal.      . ALLOPURINOL 100 MG PO TABS Oral Take 200 mg by mouth daily.      Marland Kitchen AMLODIPINE-OLMESARTAN 10-40 MG PO TABS Oral Take 1 tablet by mouth daily.      . ASPIRIN 81 MG PO TABS Oral Take 81 mg by mouth daily.      Marland Kitchen CLONIDINE HCL 0.3 MG PO TABS Oral Take 0.3 mg by mouth 2 (two) times daily.      . COLCHICINE  0.6 MG PO TABS Oral Take 0.6 mg by mouth daily.      Marland Kitchen ESOMEPRAZOLE MAGNESIUM 40 MG PO CPDR Oral Take 40 mg by mouth daily before breakfast.      . GABAPENTIN 300 MG PO CAPS Oral Take 300 mg by mouth 2 (two) times daily.      Marland Kitchen GABAPENTIN 300 MG PO CAPS Oral Take 2 capsules (600 mg total) by mouth 3 (three) times daily. 30 capsule 0  . METFORMIN HCL 500 MG PO TABS Oral Take 1,000 mg by mouth 2 (two) times daily with a meal.      . NEBIVOLOL HCL 20 MG PO TABS Oral Take 20 mg by mouth daily.      Marland Kitchen PRAVASTATIN SODIUM 10 MG PO TABS Oral Take 20 mg by mouth daily.       BP 194/108  Pulse 85  Temp(Src) 97.8 F (36.6 C) (Oral)  Resp 20  Ht 5\' 9"  (1.753 m)  Wt 275 lb (124.739 kg)  BMI 40.61 kg/m2  SpO2 98%  Physical Exam  Nursing note and vitals reviewed. Constitutional: He appears well-developed and well-nourished.  No distress.  HENT:  Head: Normocephalic and atraumatic.  Eyes: Conjunctivae are normal. Pupils are equal, round, and reactive to light.  Neck: Normal range of motion. Neck supple.  Cardiovascular: Normal rate, regular rhythm and normal heart sounds.   Pulmonary/Chest: Effort normal and breath sounds normal. No respiratory distress. He has no wheezes.  Abdominal: Soft. He exhibits no distension. There is no tenderness.  Musculoskeletal: Normal range of motion. He exhibits no tenderness.       Mild symmetric le edema  Neurological: He is alert.  Skin: Skin is warm and dry. He is not diaphoretic.  Psychiatric: He has a normal mood and affect. His behavior is normal. Thought content normal.    ED Course  Procedures (including critical care time)   Labs Reviewed  BASIC METABOLIC PANEL  CBC  URINALYSIS, ROUTINE W REFLEX MICROSCOPIC  CARDIAC PANEL(CRET KIN+CKTOT+MB+TROPI)   Results for orders placed during the hospital encounter of [id number]  BASIC METABOLIC PANEL      Component Value Range   Sodium 137  135 - 145 (mEq/L)   Potassium 3.5  3.5 - 5.1 (mEq/L)   Chloride  101  96 - 112 (mEq/L)   CO2 24  19 - 32 (mEq/L)   Glucose, Bld 249 (*) 70 - 99 (mg/dL)   BUN 17  6 - 23 (mg/dL)   Creatinine, Ser 1.40 (*) 0.50 - 1.35 (mg/dL)   Calcium 9.3  8.4 - 10.5 (mg/dL)   GFR calc non Af Amer 57 (*) >90 (mL/min)   GFR calc Af Amer 66 (*) >90 (mL/min)  CBC      Component Value Range   WBC 7.3  4.0 - 10.5 (K/uL)   RBC 4.65  4.22 - 5.81 (MIL/uL)   Hemoglobin 14.7  13.0 - 17.0 (g/dL)   HCT 42.6  39.0 - 52.0 (%)   MCV 91.6  78.0 - 100.0 (fL)   MCH 31.6  26.0 - 34.0 (pg)   MCHC 34.5  30.0 - 36.0 (g/dL)   RDW 12.1  11.5 - 15.5 (%)   Platelets 165  150 - 400 (K/uL)  URINALYSIS, ROUTINE W REFLEX MICROSCOPIC      Component Value Range   Color, Urine YELLOW  YELLOW    Appearance CLEAR  CLEAR    Specific Gravity, Urine 1.009  1.005 - 1.030    pH 5.0  5.0 - 8.0    Glucose, UA NEGATIVE  NEGATIVE (mg/dL)   Hgb urine dipstick NEGATIVE  NEGATIVE    Bilirubin Urine NEGATIVE  NEGATIVE    Ketones, ur NEGATIVE  NEGATIVE (mg/dL)   Protein, ur NEGATIVE  NEGATIVE (mg/dL)   Urobilinogen, UA 0.2  0.0 - 1.0 (mg/dL)   Nitrite NEGATIVE  NEGATIVE    Leukocytes, UA NEGATIVE  NEGATIVE   CARDIAC PANEL(CRET KIN+CKTOT+MB+TROPI)      Component Value Range   Total CK 181  7 - 232 (U/L)   CK, MB 3.8  0.3 - 4.0 (ng/mL)   Troponin I <0.30  <0.30 (ng/mL)   Relative Index 2.1  0.0 - 2.5      No results found.  EKG:  Rhythm:normal sinus Rate: 83 Axis: LEft Intervals:slightly prolonged qtc, but less than previous. LVH, aVL>66mm. Strain pattern. ST segments:NSST changes Comparison: No significant change.  Dg Chest 2 View  07/23/2011  *RADIOLOGY REPORT*  Clinical Data: 50 year old male with chest pain, hypertension, headache.  CHEST - 2 VIEW  Comparison: 09/29/2010.  Findings: Stable lung volumes.  Cardiac size at the upper limits  of normal. Other mediastinal contours are within normal limits. Visualized tracheal air column is within normal limits.  No pneumothorax, pulmonary edema,  pleural effusion or confluent pulmonary opacity.  Previously seen patchy perihilar opacity has decreased / resolved. No acute osseous abnormality identified.  IMPRESSION: No acute cardiopulmonary abnormality.  Original Report Authenticated By: Randall An, M.D.    No diagnosis found.    MDM  50yM with CP. EKG nondiagnostic. Initial cardiac enzymes wnl. Giving multiple episodes of recent CP and multiple risk factors will admit for CP r/o.        Virgel Manifold, MD 07/27/11 [phone]

## 2011-07-23 NOTE — ED Notes (Signed)
The patient is unable to urinate at this time. The dr. Ballard Russell the patient to have water. The patient stated that he will give a sample as soon as he is able.

## 2011-07-23 NOTE — ED Notes (Signed)
Pt reports improvement in chest pain.  O2 at [facility] applied.  PO fluids provided.  Awaiting admission.

## 2011-07-23 NOTE — ED Notes (Signed)
Report given to [facility]  

## 2012-07-26 ENCOUNTER — Emergency Department (HOSPITAL_COMMUNITY)
Admission: EM | Admit: 2012-07-26 | Discharge: 2012-07-26 | Disposition: A | Discharge status: Home / Self Care | Payer: Medicare Other | Attending: Emergency Medicine | Admitting: Emergency Medicine

## 2012-07-26 ENCOUNTER — Encounter (HOSPITAL_COMMUNITY): Payer: Self-pay | Admitting: Family Medicine

## 2012-07-26 DIAGNOSIS — G4730 Sleep apnea, unspecified: Secondary | ICD-10-CM | POA: Insufficient documentation

## 2012-07-26 DIAGNOSIS — I10 Essential (primary) hypertension: Secondary | ICD-10-CM | POA: Insufficient documentation

## 2012-07-26 DIAGNOSIS — E785 Hyperlipidemia, unspecified: Secondary | ICD-10-CM | POA: Insufficient documentation

## 2012-07-26 DIAGNOSIS — Z760 Encounter for issue of repeat prescription: Secondary | ICD-10-CM | POA: Insufficient documentation

## 2012-07-26 DIAGNOSIS — M109 Gout, unspecified: Secondary | ICD-10-CM | POA: Insufficient documentation

## 2012-07-26 DIAGNOSIS — E119 Type 2 diabetes mellitus without complications: Secondary | ICD-10-CM | POA: Insufficient documentation

## 2012-07-26 LAB — URINALYSIS, ROUTINE W REFLEX MICROSCOPIC
Leukocytes, UA: NEGATIVE
Nitrite: NEGATIVE
Specific Gravity, Urine: 1.026 (ref 1.005–1.030)
Urobilinogen, UA: 1 mg/dL (ref 0.0–1.0)
pH: 5.5 (ref 5.0–8.0)

## 2012-07-26 LAB — POCT I-STAT, CHEM 8
Chloride: 103 mEq/L (ref 96–112)
HCT: 45 % (ref 39.0–52.0)
Hemoglobin: 15.3 g/dL (ref 13.0–17.0)
Potassium: 3.8 mEq/L (ref 3.5–5.1)
Sodium: 142 mEq/L (ref 135–145)

## 2012-07-26 LAB — URINE MICROSCOPIC-ADD ON

## 2012-07-26 MED ORDER — LABETALOL HCL 200 MG PO TABS: 400.0000 mg | ORAL_TABLET | Freq: Two times a day (BID) | ORAL | Status: DC

## 2012-07-26 MED ORDER — METFORMIN HCL 1000 MG PO TABS: 1000.0000 mg | ORAL_TABLET | Freq: Two times a day (BID) | ORAL | Status: DC

## 2012-07-26 MED ORDER — LISINOPRIL 20 MG PO TABS: 20.0000 mg | ORAL_TABLET | Freq: Two times a day (BID) | ORAL | Status: DC

## 2012-07-26 MED ORDER — GABAPENTIN 300 MG PO CAPS: 600.0000 mg | ORAL_CAPSULE | Freq: Three times a day (TID) | ORAL | Status: DC

## 2012-07-26 MED ORDER — HYDRALAZINE HCL 50 MG PO TABS: 50.0000 mg | ORAL_TABLET | Freq: Two times a day (BID) | ORAL | Status: DC

## 2012-07-26 MED ORDER — ALLOPURINOL 300 MG PO TABS: 300.0000 mg | ORAL_TABLET | Freq: Every day | ORAL | Status: DC

## 2012-07-26 MED ORDER — ESOMEPRAZOLE MAGNESIUM 40 MG PO CPDR: 40.0000 mg | DELAYED_RELEASE_CAPSULE | Freq: Every day | ORAL | Status: DC

## 2012-07-26 NOTE — ED Notes (Signed)
Per pt just moved here form [place] and ran out of BP meds yesterday. Pt hypertensive at triage

## 2012-07-26 NOTE — ED Notes (Signed)
Pt states he has appt next month with new PCP in [place] area. Needs med refills until then

## 2012-07-26 NOTE — ED Provider Notes (Signed)
History     CSN: [id number]  Arrival date & time 07/26/12  [id number]   First MD Initiated Contact with Patient 07/26/12 1034      Chief Complaint  Patient presents with  . Hypertension  . Medication Refill    (Consider location/radiation/quality/duration/timing/severity/associated sxs/prior treatment) HPI Pt presents with request for medication refills.  He states he moved to this area recently and has an appointment with his new doctor scheduled for October 28.  He denies any current symptoms.  States his blood pressure is high and difficult to control even when he is taking his medications.  No headache, no changes in vision or speech, no chest pain or shortness of breath. There are no other associated systemic symptoms, there are no other alleviating or modifying factors.   Past Medical History  Diagnosis Date  . Hypertension   . Diabetes mellitus   . Sleep apnea   . Gout   . Hyperlipidemia   . Gout   . Hyperlipidemia     History reviewed. No pertinent past surgical history.  History reviewed. No pertinent family history.  History  Substance Use Topics  . Smoking status: Never Smoker   . Smokeless tobacco: Never Used  . Alcohol Use: Yes     occasional      Review of Systems ROS reviewed and all otherwise negative except for mentioned in HPI  Allergies  Review of patient's allergies indicates no known allergies.  Home Medications   Current Outpatient Rx  Name Route Sig Dispense Refill  . ASPIRIN EC 81 MG PO TBEC Oral Take 81 mg by mouth daily.    . COLCHICINE 0.6 MG PO TABS Oral Take 0.6 mg by mouth daily as needed. For gout    . HYDROCODONE-ACETAMINOPHEN 5-325 MG PO TABS Oral Take 1 tablet by mouth every 6 (six) hours as needed. For pain    . INDOMETHACIN 25 MG PO CAPS Oral Take 25 mg by mouth every 8 (eight) hours as needed. For gout swelling    . ALLOPURINOL 300 MG PO TABS Oral Take 1 tablet (300 mg total) by mouth daily. 30 tablet 0  . ESOMEPRAZOLE  MAGNESIUM 40 MG PO CPDR Oral Take 1 capsule (40 mg total) by mouth daily before breakfast. 30 capsule 0  . GABAPENTIN 300 MG PO CAPS Oral Take 2 capsules (600 mg total) by mouth 3 (three) times daily. 180 capsule 0  . HYDRALAZINE HCL 50 MG PO TABS Oral Take 1 tablet (50 mg total) by mouth 2 (two) times daily. 60 tablet 0  . LABETALOL HCL 200 MG PO TABS Oral Take 2 tablets (400 mg total) by mouth 2 (two) times daily. 120 tablet 0  . LISINOPRIL 20 MG PO TABS Oral Take 1 tablet (20 mg total) by mouth 2 (two) times daily. 60 tablet 0  . METFORMIN HCL 1000 MG PO TABS Oral Take 1 tablet (1,000 mg total) by mouth 2 (two) times daily with a meal. 60 tablet 0    BP 210/120  Pulse 77  Temp 97.8 F (36.6 C) (Oral)  Resp 20  SpO2 97% Vitals reviewed Physical Exam Physical Examination: General appearance - alert, well appearing, and in no distress Mental status - alert, oriented to person, place, and time Eyes - pupils equal and reactive, EOMI, no scleral icterus, no conjunctival injection Mouth - mucous membranes moist, pharynx normal without lesions Chest - clear to auscultation, no wheezes, rales or rhonchi, symmetric air entry Heart - normal rate, regular rhythm, normal  S1, S2, no murmurs, rubs, clicks or gallops Abdomen - soft, nontender, nondistended, no masses or organomegaly Neurological - alert, oriented, normal speech, strength 5/5 in extremities x 4, sensation intact, cranial nerves grossly intact Extremities - peripheral pulses normal, no pedal edema, no clubbing or cyanosis Skin - normal coloration and turgor, no rashes  ED Course  Procedures (including critical care time)  Labs Reviewed  URINALYSIS, ROUTINE W REFLEX MICROSCOPIC - Abnormal; Notable for the following:    Color, Urine AMBER (*)  BIOCHEMICALS MAY BE AFFECTED BY COLOR   Bilirubin Urine SMALL (*)     Protein, ur >300 (*)     All other components within normal limits  POCT I-STAT, CHEM 8 - Abnormal; Notable for the  following:    Creatinine, Ser 1.60 (*)     Glucose, Bld 142 (*)     All other components within normal limits  URINE MICROSCOPIC-ADD ON   No results found.   1. Hypertension       MDM  Pt presenting with hypertension and requesting refills of multiple medications.  I have gone over his medications with him and will refill hydralazine, metformin, labetalol, lisinopril, and gabapentin, allopurinol.  He is not currently having gout flare- will have his PMD prescribe these if needed- he states he has made an appointment.  Pt has renal insufficiency which is somewhat increased over his baseline.  Proteinuria as well.  He was counseled about the importance of taking BP meds to better control his BP.  Discharged with strict return precautions.  Pt agreeable with plan.        Threasa Beards, MD 07/26/12 1311

## 2012-07-26 NOTE — ED Notes (Signed)
NAD noted at time of d/c home 

## 2012-08-28 ENCOUNTER — Encounter (HOSPITAL_COMMUNITY): Payer: Self-pay | Admitting: *Deleted

## 2012-08-28 ENCOUNTER — Emergency Department (HOSPITAL_COMMUNITY)
Admission: EM | Admit: 2012-08-28 | Discharge: 2012-08-28 | Disposition: A | Discharge status: Home / Self Care | Payer: Medicare Other | Attending: Emergency Medicine | Admitting: Emergency Medicine

## 2012-08-28 DIAGNOSIS — I10 Essential (primary) hypertension: Secondary | ICD-10-CM

## 2012-08-28 DIAGNOSIS — G4730 Sleep apnea, unspecified: Secondary | ICD-10-CM | POA: Insufficient documentation

## 2012-08-28 DIAGNOSIS — M109 Gout, unspecified: Secondary | ICD-10-CM

## 2012-08-28 DIAGNOSIS — E119 Type 2 diabetes mellitus without complications: Secondary | ICD-10-CM | POA: Insufficient documentation

## 2012-08-28 DIAGNOSIS — Z79899 Other long term (current) drug therapy: Secondary | ICD-10-CM | POA: Insufficient documentation

## 2012-08-28 DIAGNOSIS — E785 Hyperlipidemia, unspecified: Secondary | ICD-10-CM | POA: Insufficient documentation

## 2012-08-28 MED ORDER — LABETALOL HCL 100 MG PO TABS: 200.0000 mg | ORAL_TABLET | Freq: Two times a day (BID) | ORAL | Status: DC

## 2012-08-28 MED ORDER — OXYCODONE-ACETAMINOPHEN 5-325 MG PO TABS: 1.0000 | ORAL_TABLET | ORAL | Status: AC | PRN

## 2012-08-28 MED ORDER — COLCHICINE 0.6 MG PO TABS: 0.6000 mg | ORAL_TABLET | Freq: Every day | ORAL | Status: DC

## 2012-08-28 MED ORDER — GABAPENTIN 300 MG PO CAPS: ORAL_CAPSULE | ORAL | Status: DC

## 2012-08-28 MED ORDER — INDOMETHACIN 25 MG PO CAPS: 25.0000 mg | ORAL_CAPSULE | Freq: Three times a day (TID) | ORAL | Status: DC | PRN

## 2012-08-28 MED ORDER — LISINOPRIL 20 MG PO TABS: 20.0000 mg | ORAL_TABLET | Freq: Two times a day (BID) | ORAL | Status: DC

## 2012-08-28 MED ORDER — HYDRALAZINE HCL 10 MG PO TABS: 50.0000 mg | ORAL_TABLET | Freq: Three times a day (TID) | ORAL | Status: DC

## 2012-08-28 MED ORDER — OMEPRAZOLE 20 MG PO CPDR: 20.0000 mg | DELAYED_RELEASE_CAPSULE | Freq: Every day | ORAL | Status: DC

## 2012-08-28 MED ORDER — METFORMIN HCL 1000 MG PO TABS: 1000.0000 mg | ORAL_TABLET | Freq: Two times a day (BID) | ORAL | Status: DC

## 2012-08-28 MED ORDER — ALLOPURINOL 300 MG PO TABS: 300.0000 mg | ORAL_TABLET | Freq: Every day | ORAL | Status: DC

## 2012-08-28 NOTE — ED Provider Notes (Signed)
History  This chart was scribed for Jordan Latina III, MD by Roe Coombs, ED Scribe. The patient was seen in room TR07C/TR07C. Patient's care was started at 0906.  CSN: [id number]  Arrival date & time 08/28/12  [id number]   First MD Initiated Contact with Patient 08/28/12 [phone]      Chief Complaint  Patient presents with  . Joint Swelling    The history is provided by the patient. No language interpreter was used.    HPI Comments: Jordan Williamson is a 51 y.o. male with a history of gout who presents to the Emergency Department complaining of moderate, constant, dull, non-radiating right knee pain, swelling and warmth onset 3 days ago. Patient states that he takes indomethacin, colchicine, allopurinol at home for treatment of gout with no relief. Patient is now out of his gout medications. There is no numbness or weakness. Patient denies any other pain. He denies any new injury or trauma.  Patient's other medical history includes HTN and diabetes and he is also out of medications to treat these conditions. Patient denies any surgical history. He has no known allergies to medications. Patient drove himself to the ED. Patient has no PCP.  Past Medical History  Diagnosis Date  . Hypertension   . Diabetes mellitus   . Sleep apnea   . Gout   . Hyperlipidemia   . Gout   . Hyperlipidemia    No family history on file.  History  Substance Use Topics  . Smoking status: Never Smoker   . Smokeless tobacco: Never Used  . Alcohol Use: Yes     Comment: occasional      Review of Systems  Musculoskeletal: Positive for joint swelling.  Neurological: Negative for weakness and numbness.  All other systems reviewed and are negative.    Allergies  Review of patient's allergies indicates no known allergies.  Home Medications   Current Outpatient Rx  Name  Route  Sig  Dispense  Refill  . ALLOPURINOL 300 MG PO TABS   Oral   Take 1 tablet (300 mg total) by mouth daily.   30 tablet   0   . ASPIRIN EC 81 MG PO TBEC   Oral   Take 81 mg by mouth daily.         . COLCHICINE 0.6 MG PO TABS   Oral   Take 0.6 mg by mouth daily as needed. For gout         . ESOMEPRAZOLE MAGNESIUM 40 MG PO CPDR   Oral   Take 1 capsule (40 mg total) by mouth daily before breakfast.   30 capsule   0   . GABAPENTIN 300 MG PO CAPS   Oral   Take 2 capsules (600 mg total) by mouth 3 (three) times daily.   180 capsule   0   . HYDRALAZINE HCL 50 MG PO TABS   Oral   Take 1 tablet (50 mg total) by mouth 2 (two) times daily.   60 tablet   0   . HYDROCODONE-ACETAMINOPHEN 5-325 MG PO TABS   Oral   Take 1 tablet by mouth every 6 (six) hours as needed. For pain         . INDOMETHACIN 25 MG PO CAPS   Oral   Take 25 mg by mouth every 8 (eight) hours as needed. For gout swelling         . LABETALOL HCL 200 MG PO TABS   Oral   Take 2  tablets (400 mg total) by mouth 2 (two) times daily.   120 tablet   0   . LISINOPRIL 20 MG PO TABS   Oral   Take 1 tablet (20 mg total) by mouth 2 (two) times daily.   60 tablet   0   . METFORMIN HCL 1000 MG PO TABS   Oral   Take 1 tablet (1,000 mg total) by mouth 2 (two) times daily with a meal.   60 tablet   0     Triage Vitals: BP 209/126  Pulse 78  Temp 97.9 F (36.6 C) (Oral)  Resp 18  SpO2 95%  Physical Exam  Nursing note and vitals reviewed. Constitutional: He is oriented to person, place, and time. He appears well-developed and well-nourished. No distress.  HENT:  Head: Normocephalic and atraumatic.  Eyes: EOM are normal. Pupils are equal, round, and reactive to light.  Neck: Neck supple. No tracheal deviation present.  Cardiovascular: Normal rate.   Pulmonary/Chest: Effort normal. No respiratory distress.  Abdominal: Soft. He exhibits no distension.  Musculoskeletal: Normal range of motion. He exhibits no edema.       Right knee: He exhibits effusion and erythema.       Effusion, warmth, redness of right knee joint  consistent with gout.  Neurological: He is alert and oriented to person, place, and time. No sensory deficit.  Skin: Skin is warm and dry.  Psychiatric: He has a normal mood and affect. His behavior is normal.    ED Course  Procedures (including critical care time) DIAGNOSTIC STUDIES: Oxygen Saturation is 95% on room air, adequate by my interpretation.    COORDINATION OF CARE: 9:13 AM- Patient informed of current plan for treatment and evaluation and agrees with plan at this time. Patient's symptoms are consistent with gout. Will provide additional prescriptions to treat gout, HTN and diabetes.  Rx indocin, colcrys, Percocet for pain.  Refilled his other prescriptions.  1. Gout   2. Hypertension      I personally performed the services described in this documentation, which was scribed in my presence. The recorded information has been reviewed and is accurate.  Katy Apo, MD     Jordan Latina III, MD 08/28/12 [phone]

## 2012-08-28 NOTE — ED Notes (Signed)
Pt is here with right knee pain and swelling from gout and is out of his gout medications

## 2012-09-21 ENCOUNTER — Encounter (HOSPITAL_COMMUNITY): Payer: Self-pay

## 2012-09-21 ENCOUNTER — Emergency Department (HOSPITAL_COMMUNITY): Payer: Medicare Other

## 2012-09-21 ENCOUNTER — Emergency Department (HOSPITAL_COMMUNITY)
Admission: EM | Admit: 2012-09-21 | Discharge: 2012-09-21 | Disposition: A | Discharge status: Home / Self Care | Payer: Medicare Other | Attending: Emergency Medicine | Admitting: Emergency Medicine

## 2012-09-21 DIAGNOSIS — M109 Gout, unspecified: Secondary | ICD-10-CM | POA: Insufficient documentation

## 2012-09-21 DIAGNOSIS — E785 Hyperlipidemia, unspecified: Secondary | ICD-10-CM | POA: Insufficient documentation

## 2012-09-21 DIAGNOSIS — E119 Type 2 diabetes mellitus without complications: Secondary | ICD-10-CM | POA: Insufficient documentation

## 2012-09-21 DIAGNOSIS — Z7982 Long term (current) use of aspirin: Secondary | ICD-10-CM | POA: Insufficient documentation

## 2012-09-21 DIAGNOSIS — Z760 Encounter for issue of repeat prescription: Secondary | ICD-10-CM | POA: Insufficient documentation

## 2012-09-21 DIAGNOSIS — I10 Essential (primary) hypertension: Secondary | ICD-10-CM | POA: Insufficient documentation

## 2012-09-21 DIAGNOSIS — Z79899 Other long term (current) drug therapy: Secondary | ICD-10-CM | POA: Insufficient documentation

## 2012-09-21 MED ORDER — OMEPRAZOLE 20 MG PO CPDR: 20.0000 mg | DELAYED_RELEASE_CAPSULE | Freq: Every day | ORAL | Status: DC

## 2012-09-21 MED ORDER — HYDROCODONE-ACETAMINOPHEN 5-325 MG PO TABS
ORAL_TABLET | ORAL | Status: DC
Start: 2012-09-21 — End: 2012-12-23

## 2012-09-21 MED ORDER — GABAPENTIN 300 MG PO CAPS: 600.0000 mg | ORAL_CAPSULE | Freq: Three times a day (TID) | ORAL | Status: DC

## 2012-09-21 MED ORDER — METFORMIN HCL 1000 MG PO TABS: 1000.0000 mg | ORAL_TABLET | Freq: Two times a day (BID) | ORAL | Status: DC

## 2012-09-21 MED ORDER — LISINOPRIL 20 MG PO TABS: 20.0000 mg | ORAL_TABLET | Freq: Two times a day (BID) | ORAL | Status: AC

## 2012-09-21 MED ORDER — HYDRALAZINE HCL 50 MG PO TABS: 50.0000 mg | ORAL_TABLET | Freq: Two times a day (BID) | ORAL | Status: AC

## 2012-09-21 MED ORDER — LABETALOL HCL 200 MG PO TABS: 200.0000 mg | ORAL_TABLET | Freq: Two times a day (BID) | ORAL | Status: DC

## 2012-09-21 MED ORDER — PREDNISONE 20 MG PO TABS: 40.0000 mg | ORAL_TABLET | Freq: Every day | ORAL | Status: DC

## 2012-09-21 NOTE — ED Notes (Signed)
Pt states his right foot started swelling 2 days ago and he thinks it is his gout flaring up. No known injury

## 2012-09-21 NOTE — ED Provider Notes (Signed)
History     CSN: [id number]  Arrival date & time 09/21/12  [id number]   First MD Initiated Contact with Patient 09/21/12 [phone]      Chief Complaint  Patient presents with  . Foot Pain  . Medication Refill     HPI Pt was seen at 1015.   Per pt, c/o gradual onset and persistence of constant right MTP area "pain" for the past 2 days.  Describes the pain as "my gout flair."  Pt also is here reqesting refills of all of his meds.  Denies CP/SOB, no abd pain, no N/V/D, no back pain, no focal motor weakness, no tingling/numbness in extremities, no fevers, no injury.     Past Medical History  Diagnosis Date  . Hypertension   . Diabetes mellitus   . Sleep apnea   . Gout   . Hyperlipidemia   . Gout   . Hyperlipidemia     History reviewed. No pertinent past surgical history.   History  Substance Use Topics  . Smoking status: Never Smoker   . Smokeless tobacco: Never Used  . Alcohol Use: Yes     Comment: occasional      Review of Systems ROS: Statement: All systems negative except as marked or noted in the HPI; Constitutional: Negative for fever and chills. ; ; Eyes: Negative for eye pain, redness and discharge. ; ; ENMT: Negative for ear pain, hoarseness, nasal congestion, sinus pressure and sore throat. ; ; Cardiovascular: Negative for chest pain, palpitations, diaphoresis, dyspnea and peripheral edema. ; ; Respiratory: Negative for cough, wheezing and stridor. ; ; Gastrointestinal: Negative for nausea, vomiting, diarrhea, abdominal pain, blood in stool, hematemesis, jaundice and rectal bleeding. . ; ; Genitourinary: Negative for dysuria, flank pain and hematuria. ; ; Musculoskeletal: +right great toe pain. Negative for back pain and neck pain. Negative for trauma.; ; Skin: Negative for pruritus, rash, abrasions, blisters, bruising and skin lesion.; ; Neuro: Negative for headache, lightheadedness and neck stiffness. Negative for weakness, altered level of consciousness , altered mental  status, extremity weakness, paresthesias, involuntary movement, seizure and syncope.       Allergies  Review of patient's allergies indicates no known allergies.  Home Medications   Current Outpatient Rx  Name  Route  Sig  Dispense  Refill  . ALLOPURINOL 300 MG PO TABS   Oral   Take 1 tablet (300 mg total) by mouth daily.   30 tablet   0   . ASPIRIN EC 81 MG PO TBEC   Oral   Take 81 mg by mouth daily.         . COLCHICINE 0.6 MG PO TABS   Oral   Take 0.6 mg by mouth 2 (two) times daily. For gout         . GABAPENTIN 300 MG PO CAPS   Oral   Take 600 mg by mouth 3 (three) times daily.         Marland Kitchen HYDRALAZINE HCL 50 MG PO TABS   Oral   Take 1 tablet (50 mg total) by mouth 2 (two) times daily.   60 tablet   0   . INDOMETHACIN 25 MG PO CAPS   Oral   Take 25 mg by mouth 3 (three) times daily. For gout swelling         . LABETALOL HCL 200 MG PO TABS   Oral   Take 200 mg by mouth 2 (two) times daily.         Marland Kitchen  LISINOPRIL 20 MG PO TABS   Oral   Take 1 tablet (20 mg total) by mouth 2 (two) times daily.   60 tablet   0   . METFORMIN HCL 1000 MG PO TABS   Oral   Take 1,000 mg by mouth 2 (two) times daily after a meal.         . OMEPRAZOLE 20 MG PO CPDR   Oral   Take 20 mg by mouth daily.           BP 212/130  Pulse 86  Temp 97.7 F (36.5 C) (Oral)  Resp 16  SpO2 96%  Physical Exam 1020: Physical examination:  Nursing notes reviewed; Vital signs and O2 SAT reviewed;  Constitutional: Well developed, Well nourished, Well hydrated, In no acute distress; Head:  Normocephalic, atraumatic; Eyes: EOMI, PERRL, No scleral icterus; ENMT: Mouth and pharynx normal, Mucous membranes moist; Neck: Supple, Full range of motion, No lymphadenopathy; Cardiovascular: Regular rate and rhythm, No murmur, rub, or gallop; Respiratory: Breath sounds clear & equal bilaterally, No rales, rhonchi, wheezes.  Speaking full sentences with ease, Normal respiratory  effort/excursion; Chest: Nontender, Movement normal;; Extremities: Pulses normal, +right MTP area with localized mild edema, erythema and tenderness to palp. No open wounds, no ecchymosis. No calf edema or asymmetry.; Neuro: AA&Ox3, Major CN grossly intact.  Speech clear. No gross focal motor or sensory deficits in extremities.; Skin: Color normal, Warm, Dry.   ED Course  Procedures    MDM  MDM Reviewed: nursing note, vitals and previous chart Interpretation: x-ray   Dg Foot Complete Right 09/21/2012  *RADIOLOGY REPORT*  Clinical Data: Pain and swelling.  RIGHT FOOT COMPLETE - 3+ VIEW  Comparison: None.  Findings: No fracture or dislocation is noted.  Joint spaces are intact.  No definite soft tissue abnormality seen.  IMPRESSION: Normal right foot.   Original Report Authenticated By: Marijo Conception.,  M.D.       [id number]:  Pt with multiple ED visits for gout and medication refills.  Strongly encouraged to obtain PMD for good continuity of care and control of his chronic medical conditions.  Verb understanding.  Pt states he will fill his rx after leaving the ED and take his BP meds, wants to leave now.       Alfonzo Feller, DO 09/22/12 1245

## 2012-09-21 NOTE — ED Notes (Signed)
Thurnell Garbe, MD notified re: BP prior to discharge

## 2012-09-21 NOTE — ED Notes (Signed)
Patient transported to X-ray 

## 2012-09-21 NOTE — ED Notes (Signed)
Pt states his BP runs high. BP on last visit 209/126

## 2012-12-23 ENCOUNTER — Emergency Department (HOSPITAL_COMMUNITY): Payer: Medicare Other

## 2012-12-23 ENCOUNTER — Emergency Department (HOSPITAL_COMMUNITY)
Admission: EM | Admit: 2012-12-23 | Discharge: 2012-12-23 | Disposition: A | Discharge status: Home / Self Care | Payer: Medicare Other | Attending: Emergency Medicine | Admitting: Emergency Medicine

## 2012-12-23 ENCOUNTER — Encounter (HOSPITAL_COMMUNITY): Payer: Self-pay | Admitting: Emergency Medicine

## 2012-12-23 DIAGNOSIS — J3489 Other specified disorders of nose and nasal sinuses: Secondary | ICD-10-CM | POA: Insufficient documentation

## 2012-12-23 DIAGNOSIS — I10 Essential (primary) hypertension: Secondary | ICD-10-CM | POA: Insufficient documentation

## 2012-12-23 DIAGNOSIS — Z7982 Long term (current) use of aspirin: Secondary | ICD-10-CM | POA: Insufficient documentation

## 2012-12-23 DIAGNOSIS — B349 Viral infection, unspecified: Secondary | ICD-10-CM

## 2012-12-23 DIAGNOSIS — IMO0001 Reserved for inherently not codable concepts without codable children: Secondary | ICD-10-CM | POA: Insufficient documentation

## 2012-12-23 DIAGNOSIS — B9789 Other viral agents as the cause of diseases classified elsewhere: Secondary | ICD-10-CM | POA: Insufficient documentation

## 2012-12-23 DIAGNOSIS — R5381 Other malaise: Secondary | ICD-10-CM | POA: Insufficient documentation

## 2012-12-23 DIAGNOSIS — Z8739 Personal history of other diseases of the musculoskeletal system and connective tissue: Secondary | ICD-10-CM | POA: Insufficient documentation

## 2012-12-23 DIAGNOSIS — R197 Diarrhea, unspecified: Secondary | ICD-10-CM

## 2012-12-23 DIAGNOSIS — R509 Fever, unspecified: Secondary | ICD-10-CM | POA: Insufficient documentation

## 2012-12-23 DIAGNOSIS — K219 Gastro-esophageal reflux disease without esophagitis: Secondary | ICD-10-CM | POA: Insufficient documentation

## 2012-12-23 DIAGNOSIS — Z79899 Other long term (current) drug therapy: Secondary | ICD-10-CM | POA: Insufficient documentation

## 2012-12-23 DIAGNOSIS — E119 Type 2 diabetes mellitus without complications: Secondary | ICD-10-CM | POA: Insufficient documentation

## 2012-12-23 HISTORY — DX: Unspecified osteoarthritis, unspecified site: M19.90

## 2012-12-23 HISTORY — DX: Gastro-esophageal reflux disease without esophagitis: K21.9

## 2012-12-23 LAB — URINALYSIS, ROUTINE W REFLEX MICROSCOPIC
Nitrite: NEGATIVE
Specific Gravity, Urine: 1.017 (ref 1.005–1.030)
Urobilinogen, UA: 0.2 mg/dL (ref 0.0–1.0)
pH: 5.5 (ref 5.0–8.0)

## 2012-12-23 LAB — URINE MICROSCOPIC-ADD ON

## 2012-12-23 LAB — CBC WITH DIFFERENTIAL/PLATELET
Basophils Absolute: 0 10*3/uL (ref 0.0–0.1)
Basophils Relative: 0 % (ref 0–1)
Eosinophils Absolute: 0.1 10*3/uL (ref 0.0–0.7)
Eosinophils Relative: 2 % (ref 0–5)
HCT: 43 % (ref 39.0–52.0)
Lymphocytes Relative: 8 % — ABNORMAL LOW (ref 12–46)
MCH: 30.5 pg (ref 26.0–34.0)
MCHC: 33.7 g/dL (ref 30.0–36.0)
MCV: 90.3 fL (ref 78.0–100.0)
Monocytes Absolute: 0.2 10*3/uL (ref 0.1–1.0)
Platelets: 150 10*3/uL (ref 150–400)
RDW: 12.5 % (ref 11.5–15.5)

## 2012-12-23 LAB — COMPREHENSIVE METABOLIC PANEL
AST: 66 U/L — ABNORMAL HIGH (ref 0–37)
CO2: 27 mEq/L (ref 19–32)
Calcium: 9.4 mg/dL (ref 8.4–10.5)
Creatinine, Ser: 2.01 mg/dL — ABNORMAL HIGH (ref 0.50–1.35)
GFR calc non Af Amer: 36 mL/min — ABNORMAL LOW (ref >90)
Total Protein: 7.4 g/dL (ref 6.0–8.3)

## 2012-12-23 LAB — GLUCOSE, CAPILLARY: Glucose-Capillary: 135 mg/dL — ABNORMAL HIGH (ref 70–99)

## 2012-12-23 MED ORDER — ACETAMINOPHEN 325 MG PO TABS
650.0000 mg | ORAL_TABLET | Freq: Once | ORAL | Status: AC
  Administered 2012-12-23: 650 mg via ORAL
  Filled 2012-12-23: qty 2

## 2012-12-23 NOTE — ED Provider Notes (Signed)
History     CSN: [id number]  Arrival date & time 12/23/12  1424  Chief Complaint  Patient presents with  . Generalized Body Aches   HPI  52 y/o male who presents with cc of fever, chills, myalgias, generalized weakness, and diarrhea that began last night. The patient denies any additional symptoms. He states he was in his usual state of health until last night when he began to have the above symptoms.   Past Medical History  Diagnosis Date  . Hypertension   . Diabetes mellitus   . Sleep apnea   . Gout   . Hyperlipidemia   . Gout   . Hyperlipidemia   . GERD (gastroesophageal reflux disease)   . Arthritis     History reviewed. No pertinent past surgical history.  No family history on file.  History  Substance Use Topics  . Smoking status: Never Smoker   . Smokeless tobacco: Never Used  . Alcohol Use: Yes     Comment: occasional    Review of Systems  Constitutional: Positive for fever, chills and fatigue.  HENT: Positive for congestion. Negative for sore throat.   Respiratory: Positive for cough. Negative for shortness of breath.   Cardiovascular: Negative for chest pain.  Gastrointestinal: Positive for diarrhea. Negative for nausea, vomiting and abdominal pain.  Musculoskeletal: Positive for myalgias.  Neurological: Positive for weakness (generalized).  All other systems reviewed and are negative.    Allergies  Review of patient's allergies indicates no known allergies.  Home Medications   Current Outpatient Rx  Name  Route  Sig  Dispense  Refill  . allopurinol (ZYLOPRIM) 300 MG tablet   Oral   Take 1 tablet (300 mg total) by mouth daily.   30 tablet   0   . aspirin EC 81 MG tablet   Oral   Take 81 mg by mouth daily.         . colchicine 0.6 MG tablet   Oral   Take 0.6 mg by mouth 2 (two) times daily. For gout         . gabapentin (NEURONTIN) 300 MG capsule   Oral   Take 2 capsules (600 mg total) by mouth 3 (three) times daily.   90  capsule   0   . hydrALAZINE (APRESOLINE) 50 MG tablet   Oral   Take 1 tablet (50 mg total) by mouth 2 (two) times daily.   60 tablet   0   . indomethacin (INDOCIN) 25 MG capsule   Oral   Take 25 mg by mouth 3 (three) times daily. For gout swelling         . labetalol (NORMODYNE) 200 MG tablet   Oral   Take 1 tablet (200 mg total) by mouth 2 (two) times daily.   60 tablet   0   . lisinopril (PRINIVIL,ZESTRIL) 20 MG tablet   Oral   Take 1 tablet (20 mg total) by mouth 2 (two) times daily.   60 tablet   0   . metFORMIN (GLUCOPHAGE) 1000 MG tablet   Oral   Take 1,000 mg by mouth 2 (two) times daily.         Marland Kitchen omeprazole (PRILOSEC) 20 MG capsule   Oral   Take 20 mg by mouth daily.           BP 164/103  Pulse 96  Temp(Src) 98.8 F (37.1 C) (Oral)  Resp 19  SpO2 98%  Physical Exam  Nursing note and vitals  reviewed. Constitutional: He is oriented to person, place, and time. He appears well-developed and well-nourished. No distress.  obese  HENT:  Head: Normocephalic and atraumatic.  Mouth/Throat: Oropharynx is clear and moist. No oropharyngeal exudate.  Eyes: Conjunctivae are normal. Pupils are equal, round, and reactive to light.  Neck: Normal range of motion. Neck supple.  Cardiovascular: Normal rate, regular rhythm and normal heart sounds.  Exam reveals no gallop and no friction rub.   No murmur heard. Pulmonary/Chest: Effort normal and breath sounds normal.  Abdominal: Soft. Bowel sounds are normal.  Musculoskeletal: Normal range of motion. He exhibits no edema and no tenderness.  Lymphadenopathy:    He has no cervical adenopathy.  Neurological: He is alert and oriented to person, place, and time.  Skin: Skin is warm and dry.  Psychiatric: He has a normal mood and affect.    ED Course  Procedures (including critical care time)  Labs Reviewed  CBC WITH DIFFERENTIAL - Abnormal; Notable for the following:    Neutrophils Relative 88 (*)     Lymphocytes Relative 8 (*)    Lymphs Abs 0.5 (*)    All other components within normal limits  COMPREHENSIVE METABOLIC PANEL - Abnormal; Notable for the following:    Glucose, Bld 149 (*)    Creatinine, Ser 2.01 (*)    Albumin 3.3 (*)    AST 66 (*)    ALT 55 (*)    GFR calc non Af Amer 36 (*)    GFR calc Af Amer 42 (*)    All other components within normal limits  URINALYSIS, ROUTINE W REFLEX MICROSCOPIC - Abnormal; Notable for the following:    Hgb urine dipstick TRACE (*)    Protein, ur 100 (*)    All other components within normal limits  GLUCOSE, CAPILLARY - Abnormal; Notable for the following:    Glucose-Capillary 135 (*)    All other components within normal limits  URINE MICROSCOPIC-ADD ON   Dg Chest 2 View  12/23/2012  *RADIOLOGY REPORT*  Clinical Data: Generalized body aches.  Weakness, fever, nausea. History of hypertension, COPD  CHEST - 2 VIEW  Comparison: 07/23/2011  Findings: Heart is enlarged.  No edema.  There are no focal consolidations or pleural effusions.  Degenerative changes are seen in the spine.  IMPRESSION: Cardiomegaly.  No edema.   Original Report Authenticated By: Nolon Nations, M.D.    1. Viral illness   2. Diarrhea      MDM  52 y/o male who presents with cc of fever, chills, myalgias, generalized weakness, and diarrhea that began last night. Labs unremarkable. CXR not c/w pneumonia. UA not c/w UTI. Likely viral illness. The patient eloped prior to discussion of lab results and receiving discharge instructions.        Donita Brooks, MD 12/23/12 [phone]

## 2012-12-23 NOTE — ED Provider Notes (Signed)
I saw and evaluated the patient, reviewed the resident's note and I agree with the findings and plan.  This 52 year old diabetic has fever chills body aches loose stools and nausea today clinically appears nontoxic I doubt sepsis.  Babette Relic, MD 12/24/12 [phone]

## 2012-12-23 NOTE — ED Notes (Signed)
Pt c/o generalized body aches and feeling weak.  Pt also nausea without vomiting.  St's has had diarrhea x's 3 today.

## 2012-12-23 NOTE — ED Notes (Signed)
RN and resident went into room to d/c patient and review pt's results. Gown left on the bed, pts belongings are gone.

## 2012-12-23 NOTE — ED Notes (Signed)
Pt states he is ready to leave that he is tired of waiting. RN informed patient his results are back and the wait should not be much longer. MD notified that patient is wanting to leave.

## 2012-12-23 NOTE — ED Notes (Signed)
Patient transported to X-ray 

## 2014-01-05 ENCOUNTER — Encounter (HOSPITAL_BASED_OUTPATIENT_CLINIC_OR_DEPARTMENT_OTHER): Payer: Self-pay | Admitting: Emergency Medicine

## 2014-01-05 ENCOUNTER — Emergency Department (HOSPITAL_BASED_OUTPATIENT_CLINIC_OR_DEPARTMENT_OTHER)
Admission: EM | Admit: 2014-01-05 | Discharge: 2014-01-05 | Disposition: A | Discharge status: Home / Self Care | Payer: Medicare Other | Attending: Emergency Medicine | Admitting: Emergency Medicine

## 2014-01-05 DIAGNOSIS — M109 Gout, unspecified: Secondary | ICD-10-CM | POA: Insufficient documentation

## 2014-01-05 DIAGNOSIS — K219 Gastro-esophageal reflux disease without esophagitis: Secondary | ICD-10-CM | POA: Insufficient documentation

## 2014-01-05 DIAGNOSIS — I10 Essential (primary) hypertension: Secondary | ICD-10-CM

## 2014-01-05 DIAGNOSIS — Z7982 Long term (current) use of aspirin: Secondary | ICD-10-CM | POA: Insufficient documentation

## 2014-01-05 DIAGNOSIS — Z79899 Other long term (current) drug therapy: Secondary | ICD-10-CM | POA: Insufficient documentation

## 2014-01-05 DIAGNOSIS — E119 Type 2 diabetes mellitus without complications: Secondary | ICD-10-CM | POA: Insufficient documentation

## 2014-01-05 DIAGNOSIS — E785 Hyperlipidemia, unspecified: Secondary | ICD-10-CM | POA: Insufficient documentation

## 2014-01-05 DIAGNOSIS — E669 Obesity, unspecified: Secondary | ICD-10-CM | POA: Insufficient documentation

## 2014-01-05 DIAGNOSIS — B3742 Candidal balanitis: Secondary | ICD-10-CM

## 2014-01-05 DIAGNOSIS — B3749 Other urogenital candidiasis: Secondary | ICD-10-CM | POA: Insufficient documentation

## 2014-01-05 DIAGNOSIS — G4730 Sleep apnea, unspecified: Secondary | ICD-10-CM | POA: Insufficient documentation

## 2014-01-05 MED ORDER — FLUCONAZOLE 100 MG PO TABS
200.0000 mg | ORAL_TABLET | Freq: Once | ORAL | Status: AC
  Administered 2014-01-05: 200 mg via ORAL
  Filled 2014-01-05: qty 2

## 2014-01-05 MED ORDER — TRIAMCINOLONE ACETONIDE 0.1 % EX CREA: 1.0000 "application " | TOPICAL_CREAM | Freq: Two times a day (BID) | CUTANEOUS | Status: DC

## 2014-01-05 NOTE — ED Notes (Signed)
Patient c/o rash on end of penis and is using triamcinolon acetonide cream. Patient requesting refill on the cream.

## 2014-01-05 NOTE — ED Provider Notes (Signed)
CSN: [id number]     Arrival date & time 01/05/14  1145 History   First MD Initiated Contact with Patient 01/05/14 1208     Chief Complaint  Patient presents with  . Rash     (Consider location/radiation/quality/duration/timing/severity/associated sxs/prior Treatment) Patient is a 53 y.o. male presenting with rash. The history is provided by the patient.  Rash Location:  Ano-genital Ano-genital rash location:  Penis Quality: blistering, burning and redness   Severity:  Mild Onset quality:  Gradual Duration:  2 days Timing:  Constant Progression:  Worsening Chronicity:  Recurrent Relieved by:  Nothing Worsened by:  Heat and moisture Ineffective treatments:  None tried Associated symptoms: no abdominal pain, no diarrhea, no fever, no myalgias, no nausea and not vomiting    Jordan Williamson is a 53 y.o. male who presents to the ED with a rash on his penis that started 2 days ago. He has had similar rash in the past and used Triamcinolone cream and it got better. He is out of the cream. Past medical history significant for HTN, DM, Hyperlipidemia. His blood pressure is up today but he has not taken his medication. He is seeing his PCP because they are having trouble getting his BP stable and have changed his medications several times. He is scheduled to follow up in  April.   Past Medical History  Diagnosis Date  . Hypertension   . Diabetes mellitus   . Sleep apnea   . Gout   . Hyperlipidemia   . Gout   . Hyperlipidemia   . GERD (gastroesophageal reflux disease)   . Arthritis    History reviewed. No pertinent past surgical history. No family history on file. History  Substance Use Topics  . Smoking status: Never Smoker   . Smokeless tobacco: Never Used  . Alcohol Use: Yes     Comment: occasional    Review of Systems  Constitutional: Negative for fever.  HENT: Negative.   Eyes: Negative for visual disturbance.  Gastrointestinal: Negative for nausea, vomiting, abdominal  pain and diarrhea.  Genitourinary: Positive for genital sores. Negative for urgency, frequency, decreased urine volume, penile swelling and testicular pain. Dysuria: only when urine goes over the rash.  Musculoskeletal: Negative for back pain and myalgias.  Skin: Positive for rash.  Psychiatric/Behavioral: Negative for confusion. The patient is not nervous/anxious.       Allergies  Review of patient's allergies indicates no known allergies.  Home Medications   Current Outpatient Rx  Name  Route  Sig  Dispense  Refill  . allopurinol (ZYLOPRIM) 300 MG tablet   Oral   Take 1 tablet (300 mg total) by mouth daily.   30 tablet   0   . aspirin EC 81 MG tablet   Oral   Take 81 mg by mouth daily.         . colchicine 0.6 MG tablet   Oral   Take 0.6 mg by mouth 2 (two) times daily. For gout         . gabapentin (NEURONTIN) 300 MG capsule   Oral   Take 2 capsules (600 mg total) by mouth 3 (three) times daily.   90 capsule   0   . hydrALAZINE (APRESOLINE) 50 MG tablet   Oral   Take 1 tablet (50 mg total) by mouth 2 (two) times daily.   60 tablet   0   . indomethacin (INDOCIN) 25 MG capsule   Oral   Take 25 mg by mouth  3 (three) times daily. For gout swelling         . labetalol (NORMODYNE) 200 MG tablet   Oral   Take 1 tablet (200 mg total) by mouth 2 (two) times daily.   60 tablet   0   . lisinopril (PRINIVIL,ZESTRIL) 20 MG tablet   Oral   Take 1 tablet (20 mg total) by mouth 2 (two) times daily.   60 tablet   0   . metFORMIN (GLUCOPHAGE) 1000 MG tablet   Oral   Take 1,000 mg by mouth 2 (two) times daily.         Marland Kitchen omeprazole (PRILOSEC) 20 MG capsule   Oral   Take 20 mg by mouth daily.          BP 194/114  Pulse 79  Temp(Src) 98.4 F (36.9 C) (Oral)  Resp 24  SpO2 100% Physical Exam  Nursing note and vitals reviewed. Constitutional: He is oriented to person, place, and time. No distress.  Obese, elevated blood pressure   HENT:  Head:  Normocephalic.  Eyes: Conjunctivae and EOM are normal.  Neck: Neck supple.  Cardiovascular: Normal rate.   Pulmonary/Chest: Effort normal.  Genitourinary: Uncircumcised.  There is a white discharge and mild erythema of the head of the penis and the shaft underneath the foreskin.   Musculoskeletal: Normal range of motion.  Neurological: He is alert and oriented to person, place, and time. No cranial nerve deficit.  Skin: Skin is warm and dry.  Psychiatric: He has a normal mood and affect. His behavior is normal.    ED Course: I discussed this case with Dr. Olen Pel  Procedures   MDM  53 y.o. obese male with burning underneath the foreskin of his penis. Will treat for monilia. Diflucan 200 mg PO given here and Rx for triamcinolone. He will follow up with his PCP for evaluation of his elevated blood pressure. He will take his medication when he leaves the ED. He will return for worsening symptoms.  Discussed with the patient and all questioned fully answered.   Medication List    TAKE these medications       triamcinolone cream 0.1 %  Commonly known as:  KENALOG  Apply 1 application topically 2 (two) times daily.      ASK your doctor about these medications       allopurinol 300 MG tablet  Commonly known as:  ZYLOPRIM  Take 1 tablet (300 mg total) by mouth daily.     aspirin EC 81 MG tablet  Take 81 mg by mouth daily.     colchicine 0.6 MG tablet  Take 0.6 mg by mouth 2 (two) times daily. For gout     gabapentin 300 MG capsule  Commonly known as:  NEURONTIN  Take 2 capsules (600 mg total) by mouth 3 (three) times daily.     hydrALAZINE 50 MG tablet  Commonly known as:  APRESOLINE  Take 1 tablet (50 mg total) by mouth 2 (two) times daily.     indomethacin 25 MG capsule  Commonly known as:  INDOCIN  Take 25 mg by mouth 3 (three) times daily. For gout swelling     labetalol 200 MG tablet  Commonly known as:  NORMODYNE  Take 1 tablet (200 mg total) by mouth 2 (two) times  daily.     lisinopril 20 MG tablet  Commonly known as:  PRINIVIL,ZESTRIL  Take 1 tablet (20 mg total) by mouth 2 (two) times daily.  metFORMIN 1000 MG tablet  Commonly known as:  GLUCOPHAGE  Take 1,000 mg by mouth 2 (two) times daily.     omeprazole 20 MG capsule  Commonly known as:  PRILOSEC  Take 20 mg by mouth daily.         [facility] Bunnie Pion, NP 01/05/14 1256

## 2014-01-05 NOTE — Discharge Instructions (Signed)
Take your blood pressure medication as soon as you can. Call your doctor to make your appointment for sooner than scheduled. Use the cream for the rash as directed. Return as needed.

## 2014-01-06 NOTE — ED Provider Notes (Signed)
Medical screening examination/treatment/procedure(s) were performed by non-physician practitioner and as supervising physician I was immediately available for consultation/collaboration.   EKG Interpretation None       Leota Jacobsen, MD 01/06/14 1504

## 2014-01-21 ENCOUNTER — Encounter (HOSPITAL_BASED_OUTPATIENT_CLINIC_OR_DEPARTMENT_OTHER): Payer: Self-pay | Admitting: Emergency Medicine

## 2014-01-21 ENCOUNTER — Emergency Department (HOSPITAL_BASED_OUTPATIENT_CLINIC_OR_DEPARTMENT_OTHER)
Admission: EM | Admit: 2014-01-21 | Discharge: 2014-01-21 | Disposition: A | Discharge status: Home / Self Care | Payer: Medicare Other | Attending: Emergency Medicine | Admitting: Emergency Medicine

## 2014-01-21 DIAGNOSIS — E119 Type 2 diabetes mellitus without complications: Secondary | ICD-10-CM | POA: Insufficient documentation

## 2014-01-21 DIAGNOSIS — Z79899 Other long term (current) drug therapy: Secondary | ICD-10-CM | POA: Insufficient documentation

## 2014-01-21 DIAGNOSIS — Z7982 Long term (current) use of aspirin: Secondary | ICD-10-CM | POA: Insufficient documentation

## 2014-01-21 DIAGNOSIS — B379 Candidiasis, unspecified: Secondary | ICD-10-CM

## 2014-01-21 DIAGNOSIS — K219 Gastro-esophageal reflux disease without esophagitis: Secondary | ICD-10-CM | POA: Insufficient documentation

## 2014-01-21 DIAGNOSIS — B3749 Other urogenital candidiasis: Secondary | ICD-10-CM | POA: Insufficient documentation

## 2014-01-21 DIAGNOSIS — M109 Gout, unspecified: Secondary | ICD-10-CM | POA: Insufficient documentation

## 2014-01-21 DIAGNOSIS — I10 Essential (primary) hypertension: Secondary | ICD-10-CM | POA: Insufficient documentation

## 2014-01-21 LAB — URINALYSIS, ROUTINE W REFLEX MICROSCOPIC
BILIRUBIN URINE: NEGATIVE
Glucose, UA: >1000 mg/dL — AB
Ketones, ur: NEGATIVE mg/dL
Leukocytes, UA: NEGATIVE
NITRITE: NEGATIVE
PH: 5.5 (ref 5.0–8.0)
Protein, ur: NEGATIVE mg/dL
Specific Gravity, Urine: 1.023 (ref 1.005–1.030)
UROBILINOGEN UA: 0.2 mg/dL (ref 0.0–1.0)

## 2014-01-21 LAB — URINE MICROSCOPIC-ADD ON

## 2014-01-21 MED ORDER — FLUCONAZOLE 50 MG PO TABS
150.0000 mg | ORAL_TABLET | Freq: Once | ORAL | Status: AC
  Administered 2014-01-21: 150 mg via ORAL
  Filled 2014-01-21 (×2): qty 1

## 2014-01-21 MED ORDER — TRIAMCINOLONE ACETONIDE 0.1 % EX CREA: 1.0000 "application " | TOPICAL_CREAM | Freq: Two times a day (BID) | CUTANEOUS | Status: DC

## 2014-01-21 MED ORDER — FLUCONAZOLE 150 MG PO TABS: 150.0000 mg | ORAL_TABLET | Freq: Once | ORAL | Status: DC

## 2014-01-21 NOTE — ED Notes (Signed)
Pt instructed not to take colchicine x 1 week per PA.

## 2014-01-21 NOTE — Discharge Instructions (Signed)
Please hold your colchicine for the next week.   Candida Infection, Adult A candida infection (also called yeast, fungus and Monilia infection) is an overgrowth of yeast that can occur anywhere on the body. A yeast infection commonly occurs in warm, moist body areas. Usually, the infection remains localized but can spread to become a systemic infection. A yeast infection may be a sign of a more severe disease such as diabetes, leukemia, or AIDS. A yeast infection can occur in both men and women. In women, Candida vaginitis is a vaginal infection. It is one of the most common causes of vaginitis. Men usually do not have symptoms or know they have an infection until other problems develop. Men may find out they have a yeast infection because their sex partner has a yeast infection. Uncircumcised men are more likely to get a yeast infection than circumcised men. This is because the uncircumcised glans is not exposed to air and does not remain as dry as that of a circumcised glans. Older adults may develop yeast infections around dentures. CAUSES  Women  Antibiotics.  Steroid medication taken for a long time.  Being overweight (obese).  Diabetes.  Poor immune condition.  Certain serious medical conditions.  Immune suppressive medications for organ transplant patients.  Chemotherapy.  Pregnancy.  Menstration.  Stress and fatigue.  Intravenous drug use.  Oral contraceptives.  Wearing tight-fitting clothes in the crotch area.  Catching it from a sex partner who has a yeast infection.  Spermicide.  Intravenous, urinary, or other catheters. Men  Catching it from a sex partner who has a yeast infection.  Having oral or anal sex with a person who has the infection.  Spermicide.  Diabetes.  Antibiotics.  Poor immune system.  Medications that suppress the immune system.  Intravenous drug use.  Intravenous, urinary, or other catheters. SYMPTOMS  Women  Thick, white  vaginal discharge.  Vaginal itching.  Redness and swelling in and around the vagina.  Irritation of the lips of the vagina and perineum.  Blisters on the vaginal lips and perineum.  Painful sexual intercourse.  Low blood sugar (hypoglycemia).  Painful urination.  Bladder infections.  Intestinal problems such as constipation, indigestion, bad breath, bloating, increase in gas, diarrhea, or loose stools. Men  Men may develop intestinal problems such as constipation, indigestion, bad breath, bloating, increase in gas, diarrhea, or loose stools.  Dry, cracked skin on the penis with itching or discomfort.  Jock itch.  Dry, flaky skin.  Athlete's foot.  Hypoglycemia. DIAGNOSIS  Women  A history and an exam are performed.  The discharge may be examined under a microscope.  A culture may be taken of the discharge. Men  A history and an exam are performed.  Any discharge from the penis or areas of cracked skin will be looked at under the microscope and cultured.  Stool samples may be cultured. TREATMENT  Women  Vaginal antifungal suppositories and creams.  Medicated creams to decrease irritation and itching on the outside of the vagina.  Warm compresses to the perineal area to decrease swelling and discomfort.  Oral antifungal medications.  Medicated vaginal suppositories or cream for repeated or recurrent infections.  Wash and dry the irritation areas before applying the cream.  Eating yogurt with lactobacillus may help with prevention and treatment.  Sometimes painting the vagina with gentian violet solution may help if creams and suppositories do not work. Men  Antifungal creams and oral antifungal medications.  Sometimes treatment must continue for 30 days  after the symptoms go away to prevent recurrence. HOME CARE INSTRUCTIONS  Women  Use cotton underwear and avoid tight-fitting clothing.  Avoid colored, scented toilet paper and deodorant tampons  or pads.  Do not douche.  Keep your diabetes under control.  Finish all the prescribed medications.  Keep your skin clean and dry.  Consume milk or yogurt with lactobacillus active culture regularly. If you get frequent yeast infections and think that is what the infection is, there are over-the-counter medications that you can get. If the infection does not show healing in 3 days, talk to your caregiver.  Tell your sex partner you have a yeast infection. Your partner may need treatment also, especially if your infection does not clear up or recurs. Men  Keep your skin clean and dry.  Keep your diabetes under control.  Finish all prescribed medications.  Tell your sex partner that you have a yeast infection so they can be treated if necessary. SEEK MEDICAL CARE IF:   Your symptoms do not clear up or worsen in one week after treatment.  You have an oral temperature above 102 F (38.9 C).  You have trouble swallowing or eating for a prolonged time.  You develop blisters on and around your vagina.  You develop vaginal bleeding and it is not your menstrual period.  You develop abdominal pain.  You develop intestinal problems as mentioned above.  You get weak or lightheaded.  You have painful or increased urination.  You have pain during sexual intercourse. MAKE SURE YOU:   Understand these instructions.  Will watch your condition.  Will get help right away if you are not doing well or get worse. Document Released: 11/04/2004 Document Revised: 12/20/2011 Document Reviewed: 02/16/2010 [facility] Patient Information 2014 [place].

## 2014-01-21 NOTE — ED Notes (Signed)
Was seen here for rash last week and states meds arent helping

## 2014-01-21 NOTE — ED Provider Notes (Signed)
Medical screening examination/treatment/procedure(s) were performed by non-physician practitioner and as supervising physician I was immediately available for consultation/collaboration.   EKG Interpretation None        Malvin Johns, MD 01/21/14 [phone]

## 2014-01-21 NOTE — ED Provider Notes (Signed)
CSN: [id number]     Arrival date & time 01/21/14  1906 History   First MD Initiated Contact with Patient 01/21/14 1935     Chief Complaint  Patient presents with  . Rash     (Consider location/radiation/quality/duration/timing/severity/associated sxs/prior Treatment) HPI Comments: Patient presents to the emergency department with chief complaint of rash. He states that he was seen here for the same thing approximately a week or 2 ago. States that he has noticed itching and pain underneath the foreskin of his penis. He is tried taking one dose of Diflucan, and using triamcinolone cream. He reports no relief. He denies fevers, chills, nausea, vomiting, or abdominal pain. He denies pain in his testicles. He states that it does not hurt when he is urinating, and less is trying to retract foreskin.  The history is provided by the patient. No language interpreter was used.    Past Medical History  Diagnosis Date  . Hypertension   . Diabetes mellitus   . Sleep apnea   . Gout   . Hyperlipidemia   . Gout   . Hyperlipidemia   . GERD (gastroesophageal reflux disease)   . Arthritis    History reviewed. No pertinent past surgical history. History reviewed. No pertinent family history. History  Substance Use Topics  . Smoking status: Never Smoker   . Smokeless tobacco: Never Used  . Alcohol Use: Yes     Comment: occasional    Review of Systems  Constitutional: Negative for fever and chills.  Respiratory: Negative for shortness of breath.   Cardiovascular: Negative for chest pain.  Gastrointestinal: Negative for nausea, vomiting, diarrhea and constipation.  Genitourinary: Positive for dysuria and genital sores.      Allergies  Review of patient's allergies indicates no known allergies.  Home Medications   Current Outpatient Rx  Name  Route  Sig  Dispense  Refill  . allopurinol (ZYLOPRIM) 300 MG tablet   Oral   Take 1 tablet (300 mg total) by mouth daily.   30 tablet   0    . aspirin EC 81 MG tablet   Oral   Take 81 mg by mouth daily.         . colchicine 0.6 MG tablet   Oral   Take 0.6 mg by mouth 2 (two) times daily. For gout         . gabapentin (NEURONTIN) 300 MG capsule   Oral   Take 2 capsules (600 mg total) by mouth 3 (three) times daily.   90 capsule   0   . hydrALAZINE (APRESOLINE) 50 MG tablet   Oral   Take 1 tablet (50 mg total) by mouth 2 (two) times daily.   60 tablet   0   . indomethacin (INDOCIN) 25 MG capsule   Oral   Take 25 mg by mouth 3 (three) times daily. For gout swelling         . labetalol (NORMODYNE) 200 MG tablet   Oral   Take 1 tablet (200 mg total) by mouth 2 (two) times daily.   60 tablet   0   . lisinopril (PRINIVIL,ZESTRIL) 20 MG tablet   Oral   Take 1 tablet (20 mg total) by mouth 2 (two) times daily.   60 tablet   0   . metFORMIN (GLUCOPHAGE) 1000 MG tablet   Oral   Take 1,000 mg by mouth 2 (two) times daily.         Marland Kitchen omeprazole (PRILOSEC) 20 MG capsule  Oral   Take 20 mg by mouth daily.         Marland Kitchen triamcinolone cream (KENALOG) 0.1 %   Topical   Apply 1 application topically 2 (two) times daily.   30 g   0    BP 156/81  Pulse 82  Temp(Src) 98.1 F (36.7 C) (Oral)  Resp 16  SpO2 94% Physical Exam  Nursing note and vitals reviewed. Constitutional: He is oriented to person, place, and time. He appears well-developed and well-nourished.  HENT:  Head: Normocephalic and atraumatic.  Eyes: Conjunctivae and EOM are normal. Pupils are equal, round, and reactive to light. Right eye exhibits no discharge. Left eye exhibits no discharge. No scleral icterus.  Neck: Normal range of motion. Neck supple. No JVD present.  Cardiovascular: Normal rate, regular rhythm and normal heart sounds.  Exam reveals no gallop and no friction rub.   No murmur heard. Pulmonary/Chest: Effort normal and breath sounds normal. No respiratory distress. He has no wheezes. He has no rales. He exhibits no  tenderness.  Abdominal: Soft. He exhibits no distension and no mass. There is no tenderness. There is no rebound and no guarding.  Genitourinary:  Uncircumcised male, penis is remarkable for what appears to be yeast infection on around the foreskin, there is no penile discharge, no masses, lesions, or other abnormality about the penis, scrotum, or testicles  Musculoskeletal: Normal range of motion. He exhibits no edema and no tenderness.  Neurological: He is alert and oriented to person, place, and time.  Skin: Skin is warm and dry.  Psychiatric: He has a normal mood and affect. His behavior is normal. Judgment and thought content normal.    ED Course  Procedures (including critical care time) Labs Review Labs Reviewed  URINALYSIS, ROUTINE W REFLEX MICROSCOPIC - Abnormal; Notable for the following:    Glucose, UA >1000 (*)    Hgb urine dipstick SMALL (*)    All other components within normal limits  URINE MICROSCOPIC-ADD ON   Imaging Review No results found.   EKG Interpretation None      MDM   Final diagnoses:  Yeast infection    Patient with yeast infection of penis.  Will check UA.  Anticipate treating with a longer course of diflucan.  Will treat with Diflucan over the course of the next 7 day.  Patient instructed to hold his colchicine during treatment.    Montine Circle, PA-C 01/21/14 2128

## 2015-12-02 ENCOUNTER — Encounter: Payer: Self-pay | Admitting: Pediatrics

## 2015-12-02 ENCOUNTER — Ambulatory Visit (INDEPENDENT_AMBULATORY_CARE_PROVIDER_SITE_OTHER): Payer: Medicare Other | Admitting: Pediatrics

## 2015-12-02 VITALS — BP 138/96 | HR 68 | Temp 97.7°F | Resp 16 | Ht 68.9 in | Wt 271.4 lb

## 2015-12-02 DIAGNOSIS — Z79899 Other long term (current) drug therapy: Secondary | ICD-10-CM

## 2015-12-02 DIAGNOSIS — T7800XA Anaphylactic reaction due to unspecified food, initial encounter: Secondary | ICD-10-CM | POA: Diagnosis not present

## 2015-12-02 DIAGNOSIS — K219 Gastro-esophageal reflux disease without esophagitis: Secondary | ICD-10-CM

## 2015-12-02 DIAGNOSIS — I152 Hypertension secondary to endocrine disorders: Secondary | ICD-10-CM

## 2015-12-02 DIAGNOSIS — J301 Allergic rhinitis due to pollen: Secondary | ICD-10-CM | POA: Diagnosis not present

## 2015-12-02 DIAGNOSIS — T63481A Toxic effect of venom of other arthropod, accidental (unintentional), initial encounter: Secondary | ICD-10-CM | POA: Diagnosis not present

## 2015-12-02 MED ORDER — EPINEPHRINE 0.3 MG/0.3ML IJ SOAJ: INTRAMUSCULAR | Status: DC

## 2015-12-02 MED ORDER — FLUTICASONE PROPIONATE 50 MCG/ACT NA SUSP: 2.0000 | Freq: Every day | NASAL | Status: DC

## 2015-12-02 MED ORDER — OMEPRAZOLE 20 MG PO CPDR: 20.0000 mg | DELAYED_RELEASE_CAPSULE | Freq: Two times a day (BID) | ORAL | Status: DC

## 2015-12-02 NOTE — Patient Instructions (Addendum)
Environmental control of dust mite Zyrtec 10 mg once a day for runny nose or itchy eyes Fluticasone 2 sprays per nostril once a day for stuffy nose Stop Afrin nasal spray Omeprazole 20 mg twice a day for reflux  Avoid shellfish. If you have an allergic reaction including insect stings, take Benadryl 50 mg every 4 hours and if you have life-threatening symptoms , inject yourself with EpiPen 0.3 mg  Monitor your blood pressure and make sure that it falls  I will call you with the results of your lab work for insect allergy

## 2015-12-02 NOTE — Progress Notes (Signed)
[place] [address] Dept: [phone]  New Patient Note  Patient ID: Jordan Williamson, male    DOB: 06-03-1961  Age: 55 y.o. MRN: [id number] Date of Office Visit: 12/02/2015 Referring provider: Bess Harvest, MD [place], [place] [address]    Chief Complaint: Allergies  HPI Jordan Williamson presents for evaluation of nasal congestion since the 1980s. His symptoms are worse in the spring and fall of the year. The last time he ate shrimp he  developed hives and swelling. He has had adult-onset diabetes for over 10 years. He has gastroesophageal reflux. He is on labetalol 200 mg twice a day to control his blood pressure. About 5 years ago he had an insect sting in one arm and developed swelling around one eye.Marland Kitchen He did not have any other allergic symptoms. He has insulin-dependent diabetes with Stage 3 kidney disease.  Review of Systems  Constitutional: Negative.   HENT:       Perennial nasal congestion since the 1980s  Eyes: Negative.   Respiratory: Negative.   Cardiovascular:       Hypertension  Gastrointestinal:       Gastroesophageal reflux  Genitourinary:       Stage III kidney disease  Musculoskeletal:       Gout  Skin: Negative.   Neurological: Negative.   Endo/Heme/Allergies:       Insulin-dependent diabetes. He has had diabetes for over 10 years  Psychiatric/Behavioral: Negative.     Outpatient Encounter Prescriptions as of 12/02/2015  Medication Sig  . allopurinol (ZYLOPRIM) 100 MG tablet Take 100 mg by mouth daily.  Marland Kitchen amLODipine (NORVASC) 10 MG tablet TAKE 1 TABLET DAILY FOR BLOOD PRESSURE (REFILL 07/04/15)  . cloNIDine (CATAPRES) 0.3 MG tablet   . famotidine (PEPCID) 20 MG tablet Take 20 mg by mouth.  Marland Kitchen glucose blood (ONE TOUCH ULTRA TEST) test strip   . hydrALAZINE (APRESOLINE) 50 MG tablet Take 1 tablet (50 mg total) by mouth 2 (two) times daily.  Marland Kitchen labetalol (NORMODYNE) 200 MG tablet Take 1 tablet (200 mg total) by mouth 2 (two) times  daily.  Elmore Guise Devices (RELION LANCING DEVICE) MISC   . lisinopril (PRINIVIL,ZESTRIL) 20 MG tablet Take 1 tablet (20 mg total) by mouth 2 (two) times daily.  Marland Kitchen LYRICA 75 MG capsule Take 75 mg by mouth 2 (two) times daily.  Marland Kitchen omeprazole (PRILOSEC) 20 MG capsule Take 1 capsule (20 mg total) by mouth 2 (two) times daily.  Glory Rosebush DELICA LANCETS [id number] MISC   . rosuvastatin (CRESTOR) 20 MG tablet Take 20 mg by mouth.  . tolnaftate (TINACTIN) 1 % cream Apply topically.  . Vitamin D, Ergocalciferol, (DRISDOL) 50000 units CAPS capsule TAKE 1 CAPSULE BY MOUTH WEEKLY FOR 12 WKS THEN RECHECK LEVEL IN THE OFFICE  . [DISCONTINUED] amLODipine (NORVASC) 10 MG tablet   . [DISCONTINUED] omeprazole (PRILOSEC) 20 MG capsule Take 20 mg by mouth daily.  . [DISCONTINUED] pregabalin (LYRICA) 75 MG capsule Take 75 mg by mouth.  Marland Kitchen aspirin EC 81 MG tablet Take 81 mg by mouth daily. Reported on 12/02/2015  . colchicine 0.6 MG tablet Take 0.6 mg by mouth 2 (two) times daily. Reported on 12/02/2015  . EPINEPHrine (EPIPEN 2-PAK) 0.3 mg/0.3 mL IJ SOAJ injection Use as directed for severe allergic reaction  . fluconazole (DIFLUCAN) 150 MG tablet Take 1 tablet (150 mg total) by mouth once. (Patient not taking: Reported on 12/02/2015)  . fluticasone (FLONASE) 50 MCG/ACT nasal spray  Place 2 sprays into both nostrils daily.  Marland Kitchen gabapentin (NEURONTIN) 300 MG capsule Take 2 capsules (600 mg total) by mouth 3 (three) times daily. (Patient not taking: Reported on 12/02/2015)  . indomethacin (INDOCIN) 25 MG capsule Take 25 mg by mouth 3 (three) times daily. Reported on 12/02/2015  . insulin aspart (NOVOLOG) 100 UNIT/ML injection Inject into the skin. Reported on 12/02/2015  . labetalol (NORMODYNE) 300 MG tablet Take 300 mg by mouth 2 (two) times daily. Reported on 12/02/2015  . metFORMIN (GLUCOPHAGE) 1000 MG tablet Take 1,000 mg by mouth 2 (two) times daily. Reported on 12/02/2015  . triamcinolone cream (KENALOG) 0.1 % Apply 1 application  topically 2 (two) times daily. (Patient not taking: Reported on 12/02/2015)  . [DISCONTINUED] allopurinol (ZYLOPRIM) 300 MG tablet Take 1 tablet (300 mg total) by mouth daily.   No facility-administered encounter medications on file as of 12/02/2015.     Drug Allergies:  Allergies  Allergen Reactions  . Shellfish Allergy Anaphylaxis    Family History: [name]'s family history includes Eczema in his son. There is no history of Allergic rhinitis, Angioedema, Asthma, Immunodeficiency, Urticaria, or Lupus..  Social and environmental. He is disabled because of his diabetes. There are no pets in the home. He has never smoked cigarettes.  Physical Exam: BP 138/96 mmHg  Pulse 68  Temp(Src) 97.7 F (36.5 C) (Oral)  Resp 16  Ht 5' 8.9" (1.75 m)  Wt 271 lb 6.2 oz (123.1 kg)  BMI 40.20 kg/m2   Physical Exam  Constitutional: He is oriented to person, place, and time. He appears well-developed and well-nourished.  HENT:  Eyes normal. Ears normal. Nose moderate swelling of nasal turbinates. Pharynx normal.  Neck: Neck supple. No thyromegaly present.  Cardiovascular:  S1 and S2 normal no murmurs  Pulmonary/Chest:  Clear to percussion and auscultation  Abdominal: Soft. There is no tenderness (no hepatosplenomegaly).  Lymphadenopathy:    He has no cervical adenopathy.  Neurological: He is alert and oriented to person, place, and time.  Skin:  Clear  Psychiatric: He has a normal mood and affect. His behavior is normal. Judgment and thought content normal.  Vitals reviewed.   Diagnostics: Allergy skin tests were extremely positive to grass pollens, weed pollen, tree pollens. Slight reactivity noted to dust mite on intradermal testing only   Assessment Assessment and Plan: 1. Allergic rhinitis due to pollen   2. Allergy with anaphylaxis due to food, initial encounter   3. Insect sting allergy, current reaction, accidental or unintentional, initial encounter   4. Hypertension due to  endocrine disorder   5. Current use of beta blocker   6. Gastroesophageal reflux disease without esophagitis   4.    Insulin-dependent diabetes  Meds ordered this encounter  Medications  . omeprazole (PRILOSEC) 20 MG capsule    Sig: Take 1 capsule (20 mg total) by mouth 2 (two) times daily.    Dispense:  60 capsule    Refill:  5    For reflux  . fluticasone (FLONASE) 50 MCG/ACT nasal spray    Sig: Place 2 sprays into both nostrils daily.    Dispense:  16 g    Refill:  5    Stuffy nose or drainage  . EPINEPHrine (EPIPEN 2-PAK) 0.3 mg/0.3 mL IJ SOAJ injection    Sig: Use as directed for severe allergic reaction    Dispense:  2 Device    Refill:  1    Dispense mylan generic brand    Patient Instructions  Environmental control of dust mite Zyrtec 10 mg once a day for runny nose or itchy eyes Fluticasone 2 sprays per nostril once a day for stuffy nose Stop Afrin nasal spray Omeprazole 20 mg twice a day for reflux  Avoid shellfish. If you have an allergic reaction including insect stings, take Benadryl 50 mg every 4 hours and if you have life-threatening symptoms , inject yourself with EpiPen 0.3 mg  Monitor your blood pressure and make sure that it falls  I will call you with the results of your lab work for insect allergy    Return in about 4 weeks (around 12/30/2015).   Thank you for the opportunity to care for this patient.  Please do not hesitate to contact me with questions.  Penne Lash, M.D.  [facility] [address] [place], [place] [address] [phone]

## 2015-12-03 ENCOUNTER — Other Ambulatory Visit: Payer: Self-pay | Admitting: Allergy

## 2015-12-03 DIAGNOSIS — K219 Gastro-esophageal reflux disease without esophagitis: Secondary | ICD-10-CM | POA: Insufficient documentation

## 2015-12-03 DIAGNOSIS — I152 Hypertension secondary to endocrine disorders: Secondary | ICD-10-CM | POA: Insufficient documentation

## 2015-12-03 DIAGNOSIS — Z79899 Other long term (current) drug therapy: Secondary | ICD-10-CM | POA: Insufficient documentation

## 2015-12-03 LAB — ALLERGEN HYMENOPTERA PANEL
ALLERGEN PAPER WASP: 0.22 kU/L — AB
ALLERGEN WHITE HORNET: 1.56 kU/L — AB
ALLERGEN YELLOW JACKET: 1.24 kU/L — AB
Honey Bee IgE: 3.38 kU/L — ABNORMAL HIGH
Yellow Hornet IgE: 1.13 kU/L — ABNORMAL HIGH

## 2015-12-30 ENCOUNTER — Ambulatory Visit: Payer: Medicare Other | Admitting: Pediatrics

## 2016-02-16 ENCOUNTER — Other Ambulatory Visit: Payer: Self-pay

## 2016-02-16 MED ORDER — FLUTICASONE PROPIONATE 50 MCG/ACT NA SUSP: 2.0000 | Freq: Every day | NASAL | Status: DC

## 2016-02-16 MED ORDER — OMEPRAZOLE 20 MG PO CPDR: 20.0000 mg | DELAYED_RELEASE_CAPSULE | Freq: Two times a day (BID) | ORAL | Status: DC

## 2017-04-22 ENCOUNTER — Other Ambulatory Visit: Payer: Self-pay | Admitting: Pediatrics

## 2017-08-01 ENCOUNTER — Ambulatory Visit (INDEPENDENT_AMBULATORY_CARE_PROVIDER_SITE_OTHER): Payer: Medicare Other | Admitting: Pediatrics

## 2017-08-01 ENCOUNTER — Encounter: Payer: Self-pay | Admitting: Pediatrics

## 2017-08-01 VITALS — BP 144/82 | HR 77 | Temp 97.7°F | Resp 20 | Ht 69.0 in | Wt 291.0 lb

## 2017-08-01 DIAGNOSIS — T7800XD Anaphylactic reaction due to unspecified food, subsequent encounter: Secondary | ICD-10-CM

## 2017-08-01 DIAGNOSIS — Z79899 Other long term (current) drug therapy: Secondary | ICD-10-CM

## 2017-08-01 DIAGNOSIS — I152 Hypertension secondary to endocrine disorders: Secondary | ICD-10-CM

## 2017-08-01 DIAGNOSIS — J301 Allergic rhinitis due to pollen: Secondary | ICD-10-CM | POA: Diagnosis not present

## 2017-08-01 DIAGNOSIS — L2089 Other atopic dermatitis: Secondary | ICD-10-CM

## 2017-08-01 DIAGNOSIS — Z794 Long term (current) use of insulin: Secondary | ICD-10-CM

## 2017-08-01 DIAGNOSIS — T63481D Toxic effect of venom of other arthropod, accidental (unintentional), subsequent encounter: Secondary | ICD-10-CM | POA: Diagnosis not present

## 2017-08-01 DIAGNOSIS — I10 Essential (primary) hypertension: Secondary | ICD-10-CM | POA: Insufficient documentation

## 2017-08-01 DIAGNOSIS — E119 Type 2 diabetes mellitus without complications: Secondary | ICD-10-CM

## 2017-08-01 DIAGNOSIS — IMO0001 Reserved for inherently not codable concepts without codable children: Secondary | ICD-10-CM

## 2017-08-01 MED ORDER — TRIAMCINOLONE ACETONIDE 0.1 % EX CREA: TOPICAL_CREAM | CUTANEOUS | 3 refills | Status: DC

## 2017-08-01 MED ORDER — TRIAMCINOLONE ACETONIDE 0.1 % EX CREA: TOPICAL_CREAM | CUTANEOUS | 3 refills | Status: AC

## 2017-08-01 MED ORDER — FLUTICASONE PROPIONATE 50 MCG/ACT NA SUSP: 2.0000 | Freq: Every day | NASAL | 5 refills | Status: DC

## 2017-08-01 MED ORDER — OMEPRAZOLE 20 MG PO CPDR: 20.0000 mg | DELAYED_RELEASE_CAPSULE | Freq: Two times a day (BID) | ORAL | 5 refills | Status: AC

## 2017-08-01 MED ORDER — EPINEPHRINE 0.3 MG/0.3ML IJ SOAJ: INTRAMUSCULAR | 1 refills | Status: AC

## 2017-08-01 NOTE — Progress Notes (Signed)
[place] [address] Dept: [phone]  FOLLOW UP NOTE  Patient ID: Jordan Williamson, male    DOB: [birth year]  Age: 56 y.o. MRN: [id number] Date of Office Visit: 08/01/2017  Assessment  Chief Complaint: Allergies  HPI Raymie VORIS TIGERT presents for evaluation of allergic rhinitis, shellfish allergy and stinging insect allergy. He was last seen in this clinic on 12/02/2015 by Dr. Shaune Leeks. At that time he was prescribed Zyrtec and Flonase nasal spray for allergic rhinitis and advised to stop taking Afrin nasal spray. We also provided a prescription for an epinephrine device for his shellfish allergy. At that visit, lab work was ordered to determine allergy to stinging insects.   Since that time, he reports he is doing well and is not currently affected by rhinitis. He is affected by rhinitis mostly in Spring and Summer. He reports he is out of Zyrtec and Flonase currently. He has not had any accidental shellfish exposures nor has he needed to use his epinephrine device. He has not has any stinging insect exposures either. He reports he has lost his EpiPen recently. Lab results from 12/02/2015 show he is allergic to honeybee, hornets, and yellow jackets and wasps . He is interested in beginning immunotherapy to stinging insects.   Kyel is not currently taking Zyrtec or Flonase and has misplaced his EpiPen. Other medications as outlined in the chart.   Drug Allergies:  Allergies  Allergen Reactions  . Shellfish Allergy Anaphylaxis  . Citalopram Other (See Comments)    Physical Exam: BP (!) 144/82   Pulse 77   Temp 97.7 F (36.5 C) (Oral)   Resp 20   Ht 5\' 9"  (1.753 m)   Wt 291 lb (132 kg)   SpO2 93%   BMI 42.97 kg/m    Physical Exam  Constitutional: He is oriented to person, place, and time. He appears well-developed and well-nourished.  HENT:  Head: Normocephalic.  Right Ear: External ear normal.  Left Ear: External ear normal.  Pharynx normal. Nose normal.    Eyes: Conjunctivae are normal.  Neck: Normal range of motion. Neck supple.  Cardiovascular: Normal rate, regular rhythm and normal heart sounds.   Pulmonary/Chest: Effort normal.  Lungs clear to aucsultation  Musculoskeletal: Normal range of motion.  Neurological: He is alert and oriented to person, place, and time.  Skin: Skin is warm and dry.  Fistula noted left arm. Positive thrill and bruit noted.  Psychiatric: He has a normal mood and affect. His behavior is normal.    Assessment and Plan: 1. Seasonal allergic rhinitis due to pollen   2. Anaphylactic shock due to food, subsequent encounter   3. Allergic reaction to insect sting, accidental or unintentional, subsequent encounter   4. Hypertension due to endocrine disorder   5. Insulin dependent diabetes mellitus ([place])   6. Current use of beta blocker   7. Flexural atopic dermatitis     Meds ordered this encounter  Medications  . EPINEPHrine (EPIPEN 2-PAK) 0.3 mg/0.3 mL IJ SOAJ injection    Sig: Use as directed for severe allergic reaction    Dispense:  2 Device    Refill:  1    Dispense mylan generic brand  . DISCONTD: triamcinolone cream (KENALOG) 0.1 %    Sig: Use twice daily as needed to red itchy areas below face and neck    Dispense:  45 g    Refill:  3  . fluticasone (FLONASE) 50 MCG/ACT nasal spray    Sig: Place  2 sprays into both nostrils daily.    Dispense:  16 g    Refill:  5    For stuffy nose  . omeprazole (PRILOSEC) 20 MG capsule    Sig: Take 1 capsule (20 mg total) by mouth 2 (two) times daily.    Dispense:  60 capsule    Refill:  5    For reflux  . triamcinolone cream (KENALOG) 0.1 %    Sig: Use twice daily as needed to red itchy areas below face and neck    Dispense:  45 g    Refill:  3      Environmental control of dust mite Zyrtec 10 mg once a day for runny nose or itchy eyes Fluticasone 2 sprays per nostril once a day for stuffy nose Omeprazole 20 mg twice a day for reflux  Avoid  shellfish and stinging insects. If you have an allergic reaction including insect stings, take Benadryl 50 mg every 4 hours and if you have life-threatening symptoms , inject yourself with EpiPen 0.3 mg  Return to the clinic on August 09, 2017 for insect testing  Monitor your blood pressure and make sure that it falls  Call us if you are not doing well on this plan  Get a flu shot  Follow up in 6 months   Return in about 9 days (around 08/10/2017), or if symptoms worsen or fail to improve, for stinging insect skin testing.    Thank you for the opportunity to care for this patient.  Please do not hesitate to contact me with questions.  Penne Lash, M.D.  [facility] [address] [place], [place] [address] [phone]

## 2017-08-01 NOTE — Patient Instructions (Addendum)
Environmental control of dust mite Zyrtec 10 mg once a day for runny nose or itchy eyes Fluticasone 2 sprays per nostril once a day for stuffy nose Omeprazole 20 mg twice a day for reflux  Avoid shellfish and stinging insects. If you have an allergic reaction including insect stings, take Benadryl 50 mg every 4 hours and if you have life-threatening symptoms , inject yourself with EpiPen 0.3 mg  Return to the clinic on August 09, 2017 for insect testing  Monitor your blood pressure and make sure that it falls  Continue taking medications as listed in chart  Call us if you are not doing well on this plan  Get a flu shot  Follow up in 6 months

## 2017-11-18 ENCOUNTER — Other Ambulatory Visit: Payer: Self-pay

## 2017-11-18 MED ORDER — FLUTICASONE PROPIONATE 50 MCG/ACT NA SUSP: 2.0000 | Freq: Every day | NASAL | 5 refills | Status: DC

## 2018-04-28 ENCOUNTER — Telehealth: Payer: Self-pay

## 2018-04-28 NOTE — Telephone Encounter (Signed)
Called [employer] @ [phone] to initiate prior authorization and they stated the pharmacy needs to bill medicare part D. I spoke to [facility] @ [phone] to let them know to bill Medicare part D and make sure it's "Mylan" brand. They shared they automatically put it for "Mylan" brand unless the doctor request something specific.  [employer] Ref# [id number]

## 2018-05-13 ENCOUNTER — Other Ambulatory Visit: Payer: Self-pay | Admitting: Pediatrics

## 2018-06-17 ENCOUNTER — Other Ambulatory Visit: Payer: Self-pay | Admitting: Pediatrics

## 2018-08-01 ENCOUNTER — Ambulatory Visit: Payer: Medicare Other | Admitting: Pediatrics

## 2018-08-04 ENCOUNTER — Other Ambulatory Visit: Payer: Self-pay | Admitting: Pediatrics

## 2018-08-04 NOTE — Telephone Encounter (Signed)
RF for fluticasone denied at [facility]. Pt needs an OV

## 2018-09-04 ENCOUNTER — Other Ambulatory Visit: Payer: Self-pay | Admitting: Pediatrics

## 2018-09-29 ENCOUNTER — Other Ambulatory Visit: Payer: Self-pay | Admitting: Pediatrics

## 2018-10-27 ENCOUNTER — Other Ambulatory Visit: Payer: Self-pay | Admitting: Pediatrics

## 2018-12-06 ENCOUNTER — Other Ambulatory Visit: Payer: Self-pay | Admitting: Pediatrics

## 2018-12-08 ENCOUNTER — Other Ambulatory Visit: Payer: Self-pay | Admitting: Pediatrics

## 2018-12-11 ENCOUNTER — Other Ambulatory Visit: Payer: Self-pay | Admitting: Allergy

## 2018-12-11 MED ORDER — EPINEPHRINE 0.3 MG/0.3ML IJ SOAJ: 0.3000 mg | Freq: Once | INTRAMUSCULAR | 2 refills | Status: AC

## 2019-01-04 ENCOUNTER — Other Ambulatory Visit: Payer: Self-pay | Admitting: Pediatrics

## 2019-01-31 ENCOUNTER — Other Ambulatory Visit: Payer: Self-pay | Admitting: Pediatrics

## 2019-03-01 ENCOUNTER — Other Ambulatory Visit: Payer: Self-pay | Admitting: Pediatrics

## 2019-03-30 ENCOUNTER — Other Ambulatory Visit: Payer: Self-pay | Admitting: Pediatrics

## 2019-12-27 ENCOUNTER — Encounter: Payer: Self-pay | Admitting: Family Medicine

## 2019-12-27 ENCOUNTER — Ambulatory Visit (INDEPENDENT_AMBULATORY_CARE_PROVIDER_SITE_OTHER): Payer: Medicare Other | Admitting: Family Medicine

## 2019-12-27 ENCOUNTER — Other Ambulatory Visit: Payer: Self-pay

## 2019-12-27 VITALS — BP 130/78 | HR 74 | Temp 96.9°F | Resp 22 | Ht 68.6 in | Wt 302.8 lb

## 2019-12-27 DIAGNOSIS — L2084 Intrinsic (allergic) eczema: Secondary | ICD-10-CM

## 2019-12-27 DIAGNOSIS — Z79899 Other long term (current) drug therapy: Secondary | ICD-10-CM

## 2019-12-27 DIAGNOSIS — T63481D Toxic effect of venom of other arthropod, accidental (unintentional), subsequent encounter: Secondary | ICD-10-CM | POA: Diagnosis not present

## 2019-12-27 DIAGNOSIS — T7800XA Anaphylactic reaction due to unspecified food, initial encounter: Secondary | ICD-10-CM | POA: Diagnosis not present

## 2019-12-27 DIAGNOSIS — J301 Allergic rhinitis due to pollen: Secondary | ICD-10-CM

## 2019-12-27 MED ORDER — EPINEPHRINE 0.3 MG/0.3ML IJ SOAJ: 0.3000 mg | Freq: Once | INTRAMUSCULAR | 2 refills | Status: AC

## 2019-12-27 MED ORDER — FLUTICASONE PROPIONATE 50 MCG/ACT NA SUSP: 2.0000 | Freq: Every day | NASAL | 5 refills | Status: AC

## 2019-12-27 MED ORDER — TRIAMCINOLONE ACETONIDE 0.1 % EX OINT: 1.0000 "application " | TOPICAL_OINTMENT | Freq: Two times a day (BID) | CUTANEOUS | 0 refills | Status: AC

## 2019-12-27 NOTE — Patient Instructions (Signed)
Allergic rhinitis Continue Claritin 10 mg once a day as needed for a runny nose Restart Flonase 2 sprays in each nostril once a day as needed for a stuffy nose Consider saline nasal rinses as needed for nasal symptoms. Use this before any medicated nasal sprays for best result  Food allergy Continue to avoid shellfish. In case of an allergic reaction, take Benadryl  mg  every 4 hours, and if life-threatening symptoms occur, inject with EpiPen 0.3 mg.  Stinging insect allergy Continue to avoid stinging insects. In case of an allergic reaction, take Benadryl  mg  every 4 hours, and if life-threatening symptoms occur, inject with EpiPen 0.3 mg.  Atopic dermitis Continue a daily moisturizing routine Continue triamcinolone % ointment to red, itchy areas below your face and neck  Call the clinic if this treatment plan is not working well for you  Follow up in 6 months or sooner if needed.

## 2019-12-27 NOTE — Progress Notes (Signed)
[place] [id number] Dept: [phone]  FOLLOW UP NOTE  Patient ID: Jordan Williamson, male    DOB: 1961/04/30  Age: 59 y.o. MRN: [id number] Date of Office Visit: 12/27/2019  Assessment  Chief Complaint: Allergic Rhinitis  (runny nose)  HPI Jordan Williamson is a 59 year old male who presents to the clinic for a follow up visit. He was last seen in this clinic on 08/01/2017 by Dr. Shaune Leeks for evaluation of allergic rhinitis, atopic dermatitis, and food allergy to shellfish and stinging insects. He has a complicated medical history including kidney failure on dialysis, hypertension, diabetes requiring insulin, and sleep apnea with non-compliance with CPAP. At today's visit, he reports his allergic rhinitis as not well controlled with symptoms including clear rhinorrhea, intermittent nasal congestion, and sneezing. He currently takes Claritin 10 mg once a day. He has used Flonase in the past, however, he is currently out of this medication. Atopic dermatitis is reported as moderately well controlled with flare and remission pattern occurring on his antecubital and popliteal fossae for which he continues occasional use of triamcinolone 0.1 % ointment. He continues to avoid shellfish with the exception of an intentional ingestion of shrimp about 4 months ago with symptoms including itching of axillae, legs, and toes. He did not have concomitant cardiopulmonary or gastrointestinal symptoms. He may have used his EpiPen at this time, however, he cannot recall this incident clearly. He reports that he was stung on the right arm by a stinging insect over the summer and did not experience any symptoms of anaphylaxis, however, he did administer his EpiPen after which he did not call 911 or go to the ED for evaluation. Upon arrival, patient with labored breathing and lethargic during the check-in process and during the patient interview. Patient oriented to person, place, and time when aroused.  Oxygen saturation 76-86% on room air with an increase to 93% on 2 liters by nasal canula. I spoke with Levada Dy, charge nurse, at the [facility] via phone and reported his symptoms to which she replied that he wears oxygen and frequently falls asleep during and after dialysis three days a week. Patient refused to have a friend or family member drive him home. Upon discharge, patient alert and oriented on room air with normal gait. His current medications are listed in the chart.   Drug Allergies:  Allergies  Allergen Reactions  . Shellfish Allergy Anaphylaxis  . Citalopram Other (See Comments)    Physical Exam: BP 130/78 (BP Location: Right Arm, Patient Position: Sitting, Cuff Size: Large)   Pulse 74   Temp (!) 96.9 F (36.1 C) (Temporal)   Resp (!) 22   Ht 5' 8.6" (1.742 m)   Wt (!) 302 lb 12.8 oz (137.3 kg)   SpO2 93% Comment: on 2 LNC  BMI 45.24 kg/m    Physical Exam Vitals reviewed.  Constitutional:      Appearance: Normal appearance.     Comments: Lethargic. Arouses to voice commands and follows directions.  HENT:     Head: Normocephalic and atraumatic.     Right Ear: Tympanic membrane normal.     Left Ear: Tympanic membrane normal.     Nose:     Comments: Bilateral nares edematous and erythematous with no nasal drainage noted. Pharynx normal. Ears normal. Eyes normal    Mouth/Throat:     Pharynx: Oropharynx is clear.  Eyes:     Conjunctiva/sclera: Conjunctivae normal.  Cardiovascular:  Rate and Rhythm: Normal rate and regular rhythm.     Pulses: Normal pulses.     Heart sounds: Normal heart sounds. No murmur.  Pulmonary:     Breath sounds: Normal breath sounds.     Comments: Tachypnic and labored Musculoskeletal:        General: Normal range of motion.     Cervical back: Normal range of motion and neck supple.     Comments: Follows commands. Raises arms equally. Squeezing hands with equal strength. Bilateral legs with equal strength.   Skin:     General: Skin is warm and dry.  Neurological:     Mental Status: He is oriented to person, place, and time.  Psychiatric:        Mood and Affect: Mood normal.        Behavior: Behavior normal.        Thought Content: Thought content normal.        Judgment: Judgment normal.     Assessment and Plan: 1. Allergic rhinitis due to pollen, unspecified seasonality   2. Intrinsic atopic dermatitis   3. Allergy with anaphylaxis due to food   4. Allergic reaction to insect sting, accidental or unintentional, subsequent encounter   5. Current use of beta blocker     Meds ordered this encounter  Medications  . fluticasone (FLONASE) 50 MCG/ACT nasal spray    Sig: Place 2 sprays into both nostrils daily.    Dispense:  1 g    Refill:  5  . triamcinolone ointment (KENALOG) 0.1 %    Sig: Apply 1 application topically 2 (two) times daily.    Dispense:  30 g    Refill:  0  . EPINEPHrine (EPIPEN 2-PAK) 0.3 mg/0.3 mL IJ SOAJ injection    Sig: Inject 0.3 mLs (0.3 mg total) into the muscle once for 1 dose.    Dispense:  2 each    Refill:  2    Patient Instructions  Allergic rhinitis Continue Claritin 10 mg once a day as needed for a runny nose Restart Flonase 2 sprays in each nostril once a day as needed for a stuffy nose Consider saline nasal rinses as needed for nasal symptoms. Use this before any medicated nasal sprays for best result  Food allergy Continue to avoid shellfish. In case of an allergic reaction, take Benadryl  mg  every 4 hours, and if life-threatening symptoms occur, inject with EpiPen 0.3 mg.  Stinging insect allergy Continue to avoid stinging insects. In case of an allergic reaction, take Benadryl  mg  every 4 hours, and if life-threatening symptoms occur, inject with EpiPen 0.3 mg.  Atopic dermitis Continue a daily moisturizing routine Continue triamcinolone % ointment to red, itchy areas below your face and neck  Call the clinic if this treatment plan is not working  well for you  Follow up in 6 months or sooner if needed.   Return in about 6 months (around 06/28/2020), or if symptoms worsen or fail to improve.    Thank you for the opportunity to care for this patient.  Please do not hesitate to contact me with questions.  Gareth Morgan, FNP [facility]  I have provided oversight concerning Gareth Morgan' evaluation and treatment of this patient's health issues addressed during today's encounter. I agree with the assessment and therapeutic plan as outlined in the note.   Thank you for the opportunity to care for this patient.  Please do not hesitate to  contact me with questions.  Penne Lash, M.D.  [facility] [address] [place], [place] [address] [phone]

## 2020-08-05 ENCOUNTER — Other Ambulatory Visit: Payer: Self-pay

## 2020-08-05 ENCOUNTER — Emergency Department (HOSPITAL_BASED_OUTPATIENT_CLINIC_OR_DEPARTMENT_OTHER): Payer: Medicare Other

## 2020-08-05 ENCOUNTER — Emergency Department (HOSPITAL_BASED_OUTPATIENT_CLINIC_OR_DEPARTMENT_OTHER)
Admission: EM | Admit: 2020-08-05 | Discharge: 2020-08-05 | Disposition: A | Discharge status: Home / Self Care | Payer: Medicare Other | Attending: Emergency Medicine | Admitting: Emergency Medicine

## 2020-08-05 ENCOUNTER — Encounter (HOSPITAL_BASED_OUTPATIENT_CLINIC_OR_DEPARTMENT_OTHER): Payer: Self-pay | Admitting: Emergency Medicine

## 2020-08-05 DIAGNOSIS — Z992 Dependence on renal dialysis: Secondary | ICD-10-CM | POA: Insufficient documentation

## 2020-08-05 DIAGNOSIS — R0682 Tachypnea, not elsewhere classified: Secondary | ICD-10-CM | POA: Diagnosis not present

## 2020-08-05 DIAGNOSIS — R1032 Left lower quadrant pain: Secondary | ICD-10-CM

## 2020-08-05 DIAGNOSIS — E1122 Type 2 diabetes mellitus with diabetic chronic kidney disease: Secondary | ICD-10-CM | POA: Diagnosis not present

## 2020-08-05 DIAGNOSIS — Z79899 Other long term (current) drug therapy: Secondary | ICD-10-CM | POA: Insufficient documentation

## 2020-08-05 DIAGNOSIS — J810 Acute pulmonary edema: Secondary | ICD-10-CM | POA: Insufficient documentation

## 2020-08-05 DIAGNOSIS — Z7984 Long term (current) use of oral hypoglycemic drugs: Secondary | ICD-10-CM | POA: Diagnosis not present

## 2020-08-05 DIAGNOSIS — R1030 Lower abdominal pain, unspecified: Secondary | ICD-10-CM | POA: Diagnosis present

## 2020-08-05 DIAGNOSIS — E8779 Other fluid overload: Secondary | ICD-10-CM

## 2020-08-05 DIAGNOSIS — E8770 Fluid overload, unspecified: Secondary | ICD-10-CM | POA: Diagnosis not present

## 2020-08-05 DIAGNOSIS — I120 Hypertensive chronic kidney disease with stage 5 chronic kidney disease or end stage renal disease: Secondary | ICD-10-CM | POA: Diagnosis not present

## 2020-08-05 DIAGNOSIS — N186 End stage renal disease: Secondary | ICD-10-CM | POA: Insufficient documentation

## 2020-08-05 LAB — BASIC METABOLIC PANEL
Anion gap: 16 — ABNORMAL HIGH (ref 5–15)
BUN: 29 mg/dL — ABNORMAL HIGH (ref 6–20)
CO2: 24 mmol/L (ref 22–32)
Calcium: 8.9 mg/dL (ref 8.9–10.3)
Chloride: 103 mmol/L (ref 98–111)
Creatinine, Ser: 7.89 mg/dL — ABNORMAL HIGH (ref 0.61–1.24)
GFR, Estimated: 7 mL/min — ABNORMAL LOW (ref >60)
Glucose, Bld: 159 mg/dL — ABNORMAL HIGH (ref 70–99)
Potassium: 4.2 mmol/L (ref 3.5–5.1)
Sodium: 143 mmol/L (ref 135–145)

## 2020-08-05 LAB — CBC WITH DIFFERENTIAL/PLATELET
Abs Immature Granulocytes: 0.07 10*3/uL (ref 0.00–0.07)
Basophils Absolute: 0.1 10*3/uL (ref 0.0–0.1)
Basophils Relative: 1 %
Eosinophils Absolute: 0.1 10*3/uL (ref 0.0–0.5)
Eosinophils Relative: 2 %
HCT: 28.7 % — ABNORMAL LOW (ref 39.0–52.0)
Hemoglobin: 8.7 g/dL — ABNORMAL LOW (ref 13.0–17.0)
Immature Granulocytes: 1 %
Lymphocytes Relative: 6 %
Lymphs Abs: 0.4 10*3/uL — ABNORMAL LOW (ref 0.7–4.0)
MCH: 28 pg (ref 26.0–34.0)
MCHC: 30.3 g/dL (ref 30.0–36.0)
MCV: 92.3 fL (ref 80.0–100.0)
Monocytes Absolute: 0.3 10*3/uL (ref 0.1–1.0)
Monocytes Relative: 4 %
Neutro Abs: 6.3 10*3/uL (ref 1.7–7.7)
Neutrophils Relative %: 86 %
Platelets: 167 10*3/uL (ref 150–400)
RBC: 3.11 MIL/uL — ABNORMAL LOW (ref 4.22–5.81)
RDW: 18.1 % — ABNORMAL HIGH (ref 11.5–15.5)
WBC: 7.3 10*3/uL (ref 4.0–10.5)
nRBC: 0 % (ref 0.0–0.2)

## 2020-08-05 MED ORDER — OXYCODONE-ACETAMINOPHEN 5-325 MG PO TABS
2.0000 | ORAL_TABLET | Freq: Once | ORAL | Status: AC
  Administered 2020-08-05: 2 via ORAL
  Filled 2020-08-05: qty 2

## 2020-08-05 NOTE — ED Notes (Signed)
Placed on 3L [place] which patient is on at home.

## 2020-08-05 NOTE — ED Provider Notes (Signed)
[facility] EMERGENCY DEPARTMENT Provider Note   CSN: [id number] Arrival date & time: 08/05/20  1116     History Chief Complaint  Patient presents with  . Groin Pain    Jordan Williamson is a 59 y.o. male with a past medical history of obesity, hypertension, hyperlipidemia, diabetes, end-stage renal disease on hemodialysis Tuesday Thursday Saturday.  Patient states that he completed about 3 hours of dialysis today but had to leave due to severe groin pain.  The patient has chronic hypoxic respiratory failure and is chronically on 3 L of oxygen at all times.  Patient is that yesterday he began having intermittent sharp pain in the left groin that seems to either radiate to his testicle or the front of his leg.  He states he is really having some difficulty placing the path of the pain.  Describes it as sharp.  Yesterday it was intermittent but now much worse, and when he awoke this morning it became constant.  The pain is worsened whenever he tries to stand up and ambulate.  He denies a history of kidney stones.  He does not make urine.  He has never had anything like this before. HPI     Past Medical History:  Diagnosis Date  . Arthritis   . Diabetes mellitus   . GERD (gastroesophageal reflux disease)   . Gout   . Gout   . Hyperlipidemia   . Hyperlipidemia   . Hypertension   . Sleep apnea     Patient Active Problem List   Diagnosis Date Noted  . Intrinsic atopic dermatitis 12/27/2019  . Essential hypertension 08/01/2017  . Hypertension due to endocrine disorder 12/03/2015  . Current use of beta blocker 12/03/2015  . Gastroesophageal reflux disease without esophagitis 12/03/2015  . Allergic rhinitis due to pollen 12/02/2015  . 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 Insect sting allergy, current reaction 12/02/2015    Past Surgical History:  Procedure Laterality Date  . DIALYSIS/PERMA CATHETER INSERTION Right    right arm, placed around july        Family History  Problem Relation Age of Onset  . Eczema Son   . Allergic rhinitis Neg Hx   . Angioedema Neg Hx   . Asthma Neg Hx   . Immunodeficiency Neg Hx   . Urticaria Neg Hx   . Lupus Neg Hx     Social History   Tobacco Use  . Smoking status: Never Smoker  . Smokeless tobacco: Never Used  Vaping Use  . Vaping Use: Never used  Substance Use Topics  . Alcohol use: Yes    Comment: occasional  . Drug use: No    Home Medications Prior to Admission medications   Medication Sig Start Date End Date Taking? Authorizing Provider  allopurinol (ZYLOPRIM) 100 MG tablet Take 100 mg by mouth daily. 11/20/15   [provider]  amLODipine (NORVASC) 10 MG tablet TAKE 1 TABLET DAILY FOR BLOOD PRESSURE (REFILL 07/04/15) 11/20/15   [provider]  EPINEPHrine (EPIPEN 2-PAK) 0.3 mg/0.3 mL IJ SOAJ injection Use as directed for severe allergic reaction 08/01/17   Bardelas, Jose A, MD  fluticasone (FLONASE) 50 MCG/ACT nasal spray USE 2 SPRAYS IN EACH NOSTRIL DAILY FOR STUFFY NOSE *PATIENT NEEDS APPOINTMENT* 12/06/18   Charlies Silvers, MD  fluticasone (FLONASE) 50 MCG/ACT nasal spray Place 2 sprays into both nostrils daily. 12/27/19   Dara Hoyer, FNP  glucose blood (ONE TOUCH ULTRA TEST) test strip  10/31/15   [provider]  hydrALAZINE (APRESOLINE) 50 MG tablet Take 1 tablet (50 mg total) by mouth 2 (two) times daily. 09/21/12   Francine Graven, DO  insulin lispro (HUMALOG KWIKPEN) 100 UNIT/ML KiwkPen INJECT SUBCUTANEOUSLY AS DIRECTED. USE SLIDING SCALE SHEET GIVEN IN CLINIC. 01/06/16   [provider]  labetalol (NORMODYNE) 300 MG tablet Take 300 mg by mouth 2 (two) times daily. Reported on 12/02/2015 09/26/15   [provider]  Lancet Devices (RELION LANCING DEVICE) [place]  11/05/10   [provider]  lisinopril (PRINIVIL,ZESTRIL) 20 MG tablet Take 1 tablet (20 mg total) by mouth 2 (two) times daily. 09/21/12   Francine Graven, DO   LYRICA 75 MG capsule Take 75 mg by mouth 2 (two) times daily. 11/20/15   [provider]  omeprazole (PRILOSEC) 20 MG capsule Take 1 capsule (20 mg total) by mouth 2 (two) times daily. 08/01/17   Charlies Silvers, MD  [place] LANCETS 00X [place]  10/31/15   [provider]  rosuvastatin (CRESTOR) 20 MG tablet Take 20 mg by mouth. 10/26/15   [provider]  triamcinolone cream (KENALOG) 0.1 % Use twice daily as needed to red itchy areas below face and neck 08/01/17   Charlies Silvers, MD  triamcinolone ointment (KENALOG) 0.1 % Apply 1 application topically 2 (two) times daily. 12/27/19   Dara Hoyer, FNP    Allergies    Shellfish allergy and Citalopram  Review of Systems   Review of Systems Ten systems reviewed and are negative for acute change, except as noted in the HPI.   Physical Exam Updated Vital Signs BP 137/82   Pulse 84   Ht 5\' 9"  (1.753 m)   Wt 133.8 kg   SpO2 95%   BMI 43.56 kg/m   Physical Exam Vitals and nursing note reviewed.  Constitutional:      General: He is not in acute distress.    Appearance: He is well-developed. He is obese. He is not diaphoretic.     Comments: Patient is tachypneic, on oxygen.  He appears volume overloaded.  Lower extremities are wrapped in Unna boots with obvious serous seepage through his dressings.   HENT:     Head: Normocephalic and atraumatic.  Eyes:     General: No scleral icterus.    Conjunctiva/sclera: Conjunctivae normal.  Cardiovascular:     Rate and Rhythm: Normal rate and regular rhythm.     Heart sounds: Normal heart sounds.  Pulmonary:     Effort: Pulmonary effort is normal. Tachypnea present. No respiratory distress.     Breath sounds: Normal breath sounds. Decreased air movement present.  Abdominal:     Palpations: Abdomen is soft.     Tenderness: There is no abdominal tenderness.     Comments: No abdominal tenderness  Genitourinary:    Comments: GU examination chaperoned.  No  obvious palpable hernias.  Bilateral testicles are significantly swollen, difficult to palpate testicles given swelling and body habitus however there is no tenderness on my examination. Musculoskeletal:     Cervical back: Normal range of motion and neck supple.     Right lower leg: Edema present.     Left lower leg: Edema present.     Comments: Pitting edema extends above the knees No obvious pain with passive range of motion of the left hip.  Skin:    General: Skin is warm and dry.  Neurological:     Mental Status: He is alert.  Psychiatric:  Behavior: Behavior normal.     ED Results / Procedures / Treatments   Labs (all labs ordered are listed, but only abnormal results are displayed) Labs Reviewed - No data to display  EKG None  Radiology No results found.  Procedures Procedures (including critical care time)  Medications Ordered in ED Medications - No data to display  ED Course  I have reviewed the triage vital signs and the nursing notes.  Pertinent labs & imaging results that were available during my care of the patient were reviewed by me and considered in my medical decision making (see chart for details).    MDM Rules/Calculators/A&P                           59 year old male with history of end-stage renal disease.  He states that he had 3 hours of dialysis and that they "could not pull any more fluid off of me."  I somehow have high suspicion given his clinical appearance and evidence of pulmonary edema that this is untrue however he is currently at 95% on his 3 L of oxygen which is his baseline and states that he always feels like he does today I ordered and reviewed labs.  Patient has elevated creatinine and BUN.  He also has a normocytic anemia at 8.7.  I do not have a comparative value however this is likely chronic given the patient's end-stage renal disease.  I ordered and reviewed labs including a Doppler ultrasound of the scrotum, portable 1 view  chest x-ray and left hip x-ray.  Doppler of the scrotum shows bilateral hydroceles without evidence of torsion.  Portable 1 view chest x-ray shows cardiomegaly and pulmonary edema.  Hip x-ray is without abnormality. .  Patient has bilateral leg wounds are being managed in the outpatient setting.  He states that he just had them checked and that they were reportedly doing well.  I evaluated the patient's groin.  Differential diagnosis includes hernia, ureteral colic, epididymitis, orchitis, hernia, torsion, referred pain from the hip, psoas abscess, muscle strain, radiculopathy.  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  Patient was given pain medication here in the emergency department for manage of his management of his pain.  He is on chronic pain meds and may take what he is prescribed at home.  I have advised him to follow closely with his primary care physician regarding his chronic pain issues, groin pain, wound management and to follow closely with hemodialysis.  He may return if he is having any worsening trouble in his breathing, or new symptoms. Final Clinical Impression(s) / ED Diagnoses Final diagnoses:  None    Rx / DC Orders ED Discharge Orders    None       Margarita Mail, PA-C 08/05/20 [place], [place], DO 08/06/20 [phone]

## 2020-08-05 NOTE — ED Triage Notes (Signed)
Reports left groin pain that radiates into his left testicle.  Started last night.  Was unable to complete dialysis this morning due to the pain.

## 2020-08-05 NOTE — Discharge Instructions (Signed)
It appears you still have a lot of fluid in your lungs and on your body after dialysis.  You may need to ask your hemodialysis center to remove more fluid it is visible on your chest x-ray in your lungs this also may be contributing to the amount of ulcers on your legs due to the amount of fluid that is constantly stretching out the skin and causing ulceration.  Please follow-up with your primary care doctor regarding your leg wounds.  Please make sure you complete all of your dialysis when you go.  Return for any new or worsening symptoms.

## 2020-09-10 DEATH — deceased

## 2021-04-02 IMAGING — DX DG HIP (WITH OR WITHOUT PELVIS) 2-3V*L*
3 series · 3 of 3 positions shown · non-contrast
Comparison: None.

CLINICAL DATA: Left groin pain

EXAM:
DG HIP (WITH OR WITHOUT PELVIS) 2-3V LEFT

[pelvis ap]
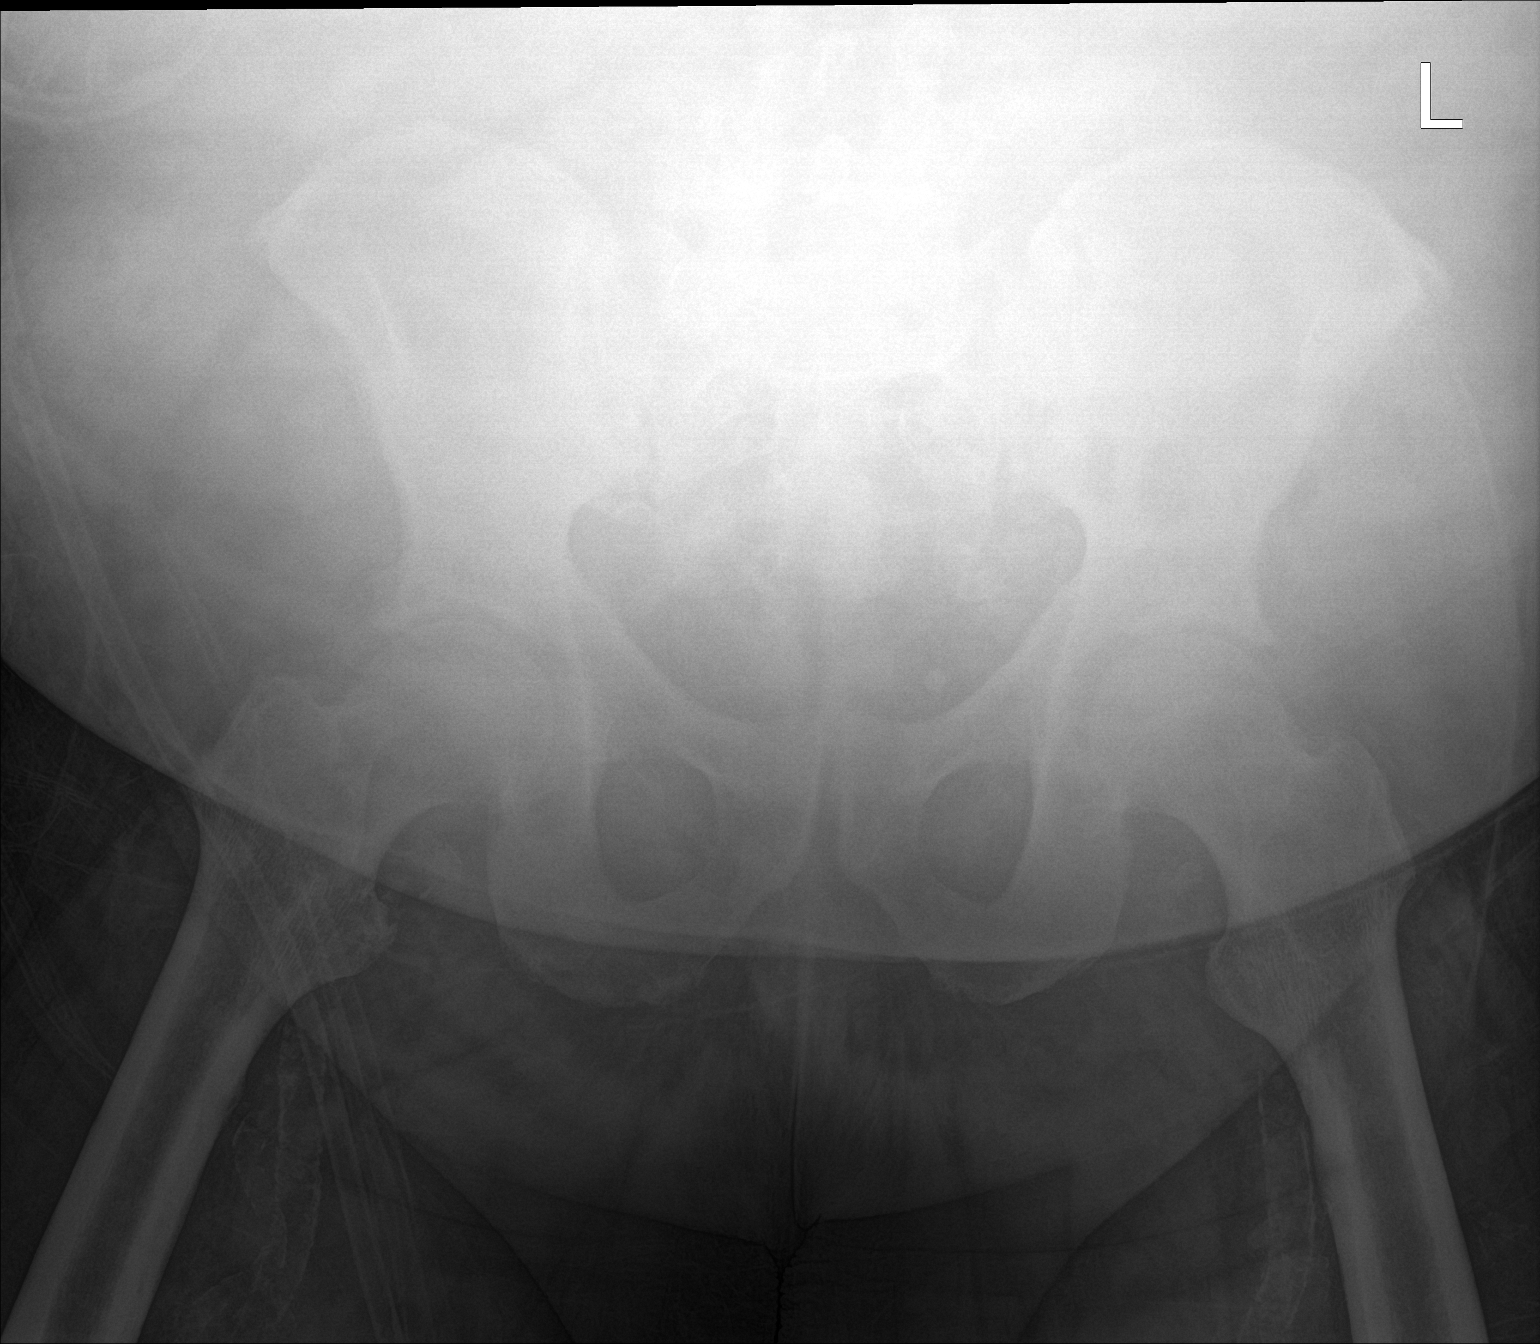

[hip ap]
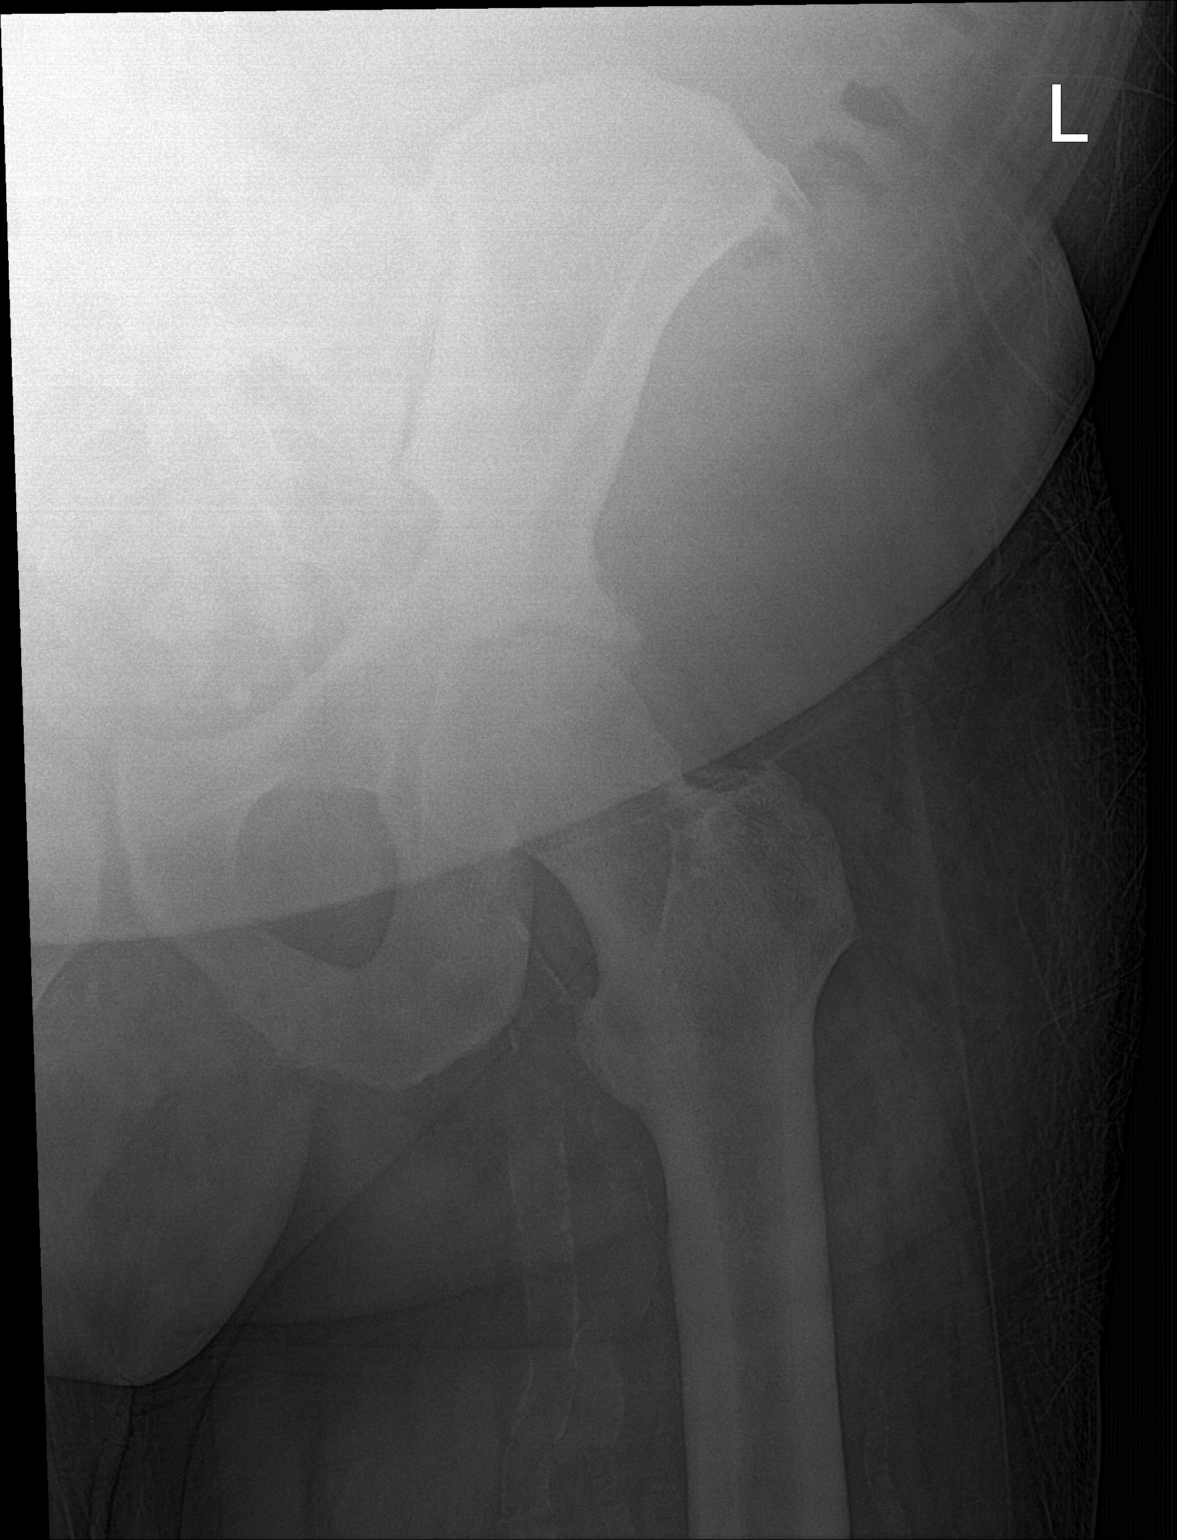

[hip lat]
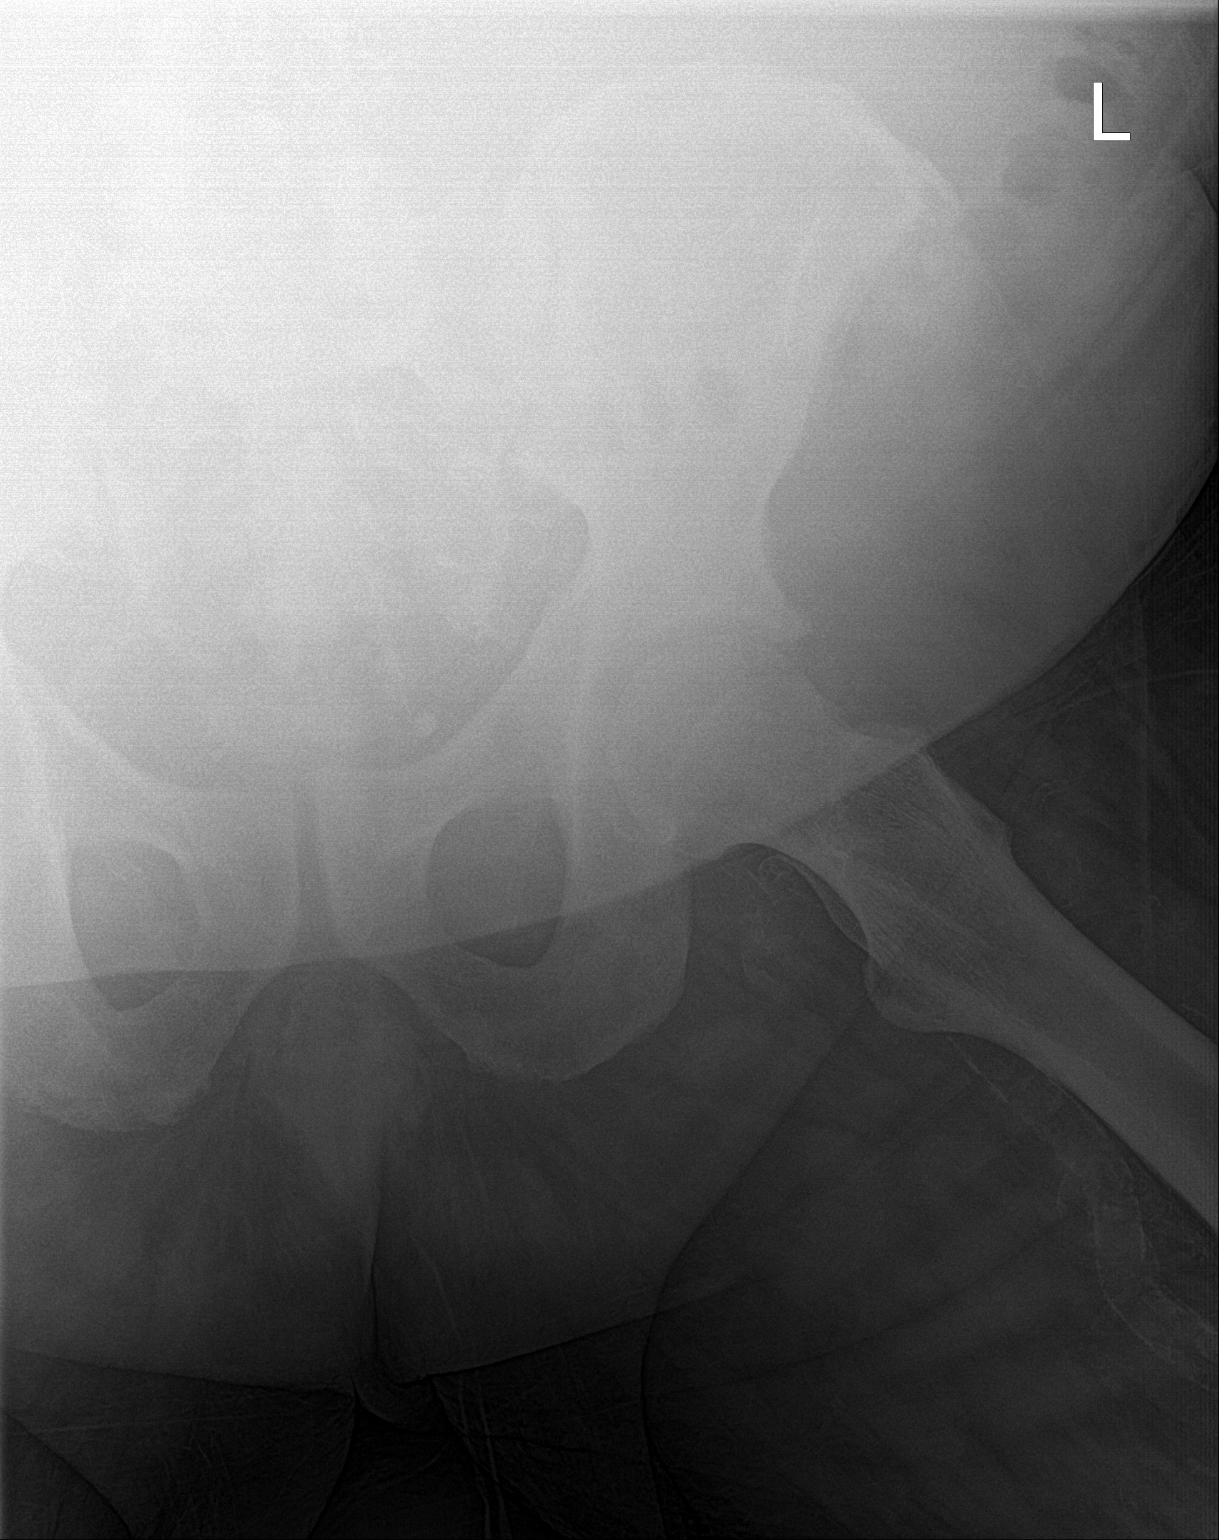

[3 of 3 positions shown; findings below may reference images not displayed]

FINDINGS: Examination is limited by poor penetration secondary to patient body
habitus. There is no evidence of hip fracture or dislocation. Mild
degenerative changes of the bilateral hips. Prominent
atherosclerotic vascular calcifications.
IMPRESSION: No acute osseous abnormality, left hip.

## 2021-04-02 IMAGING — US US SCROTUM W/ DOPPLER COMPLETE
1 series · 13 of 25 positions shown · non-contrast
Comparison: None.

CLINICAL DATA: Left scrotal region soft tissue swelling

EXAM:
SCROTAL ULTRASOUND
DOPPLER ULTRASOUND OF THE TESTICLES
TECHNIQUE: Complete ultrasound examination of the testicles, epididymis, and
other scrotal structures was performed. Color and spectral Doppler
ultrasound were also utilized to evaluate blood flow to the
testicles.

[Series 1: us scrotum w/ doppler complete · 13 of 33 slices shown]
[im 1/33]
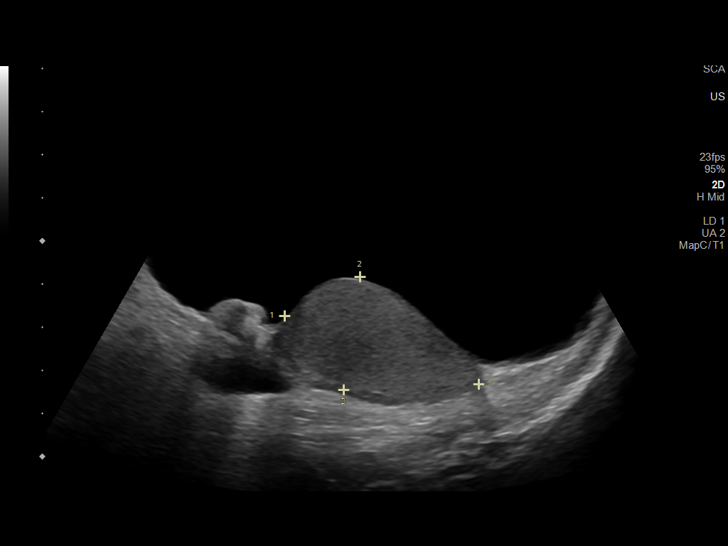
[im 3/33]
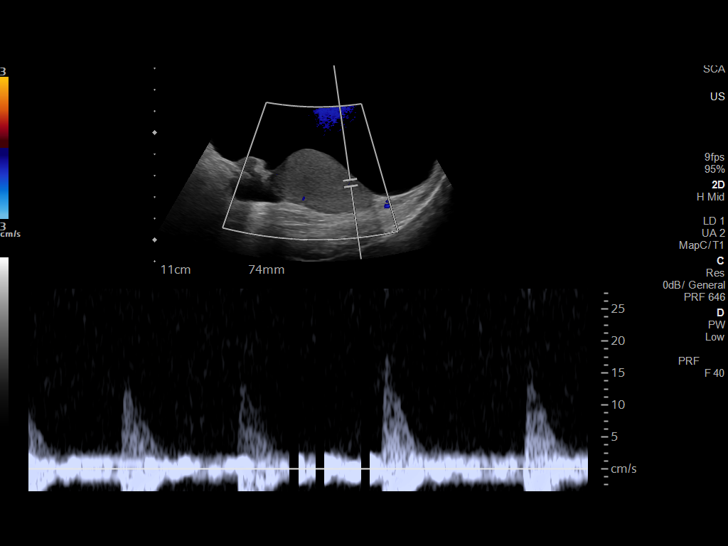
[im 6/33]
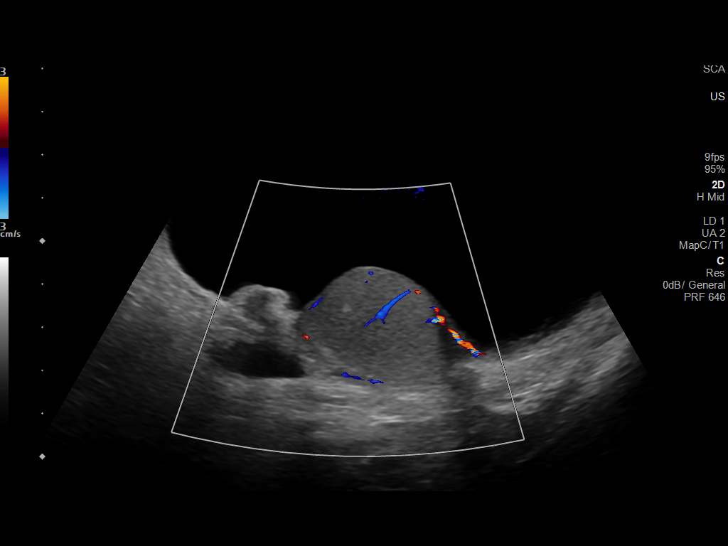
[im 9/33]
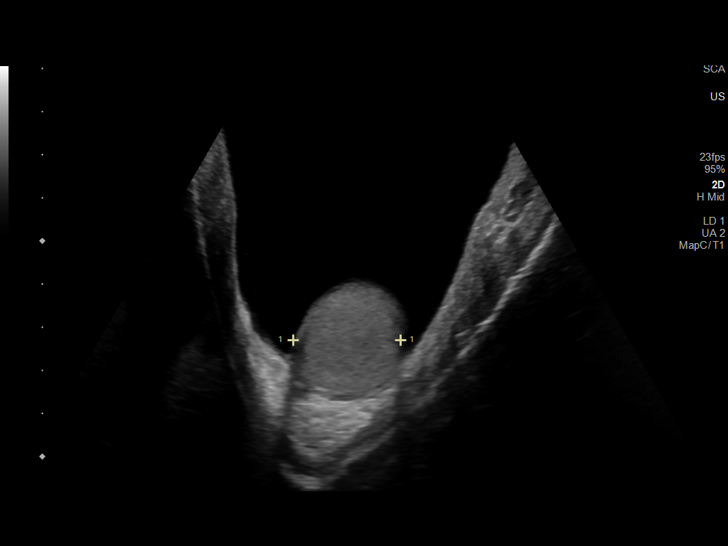
[im 11/33]
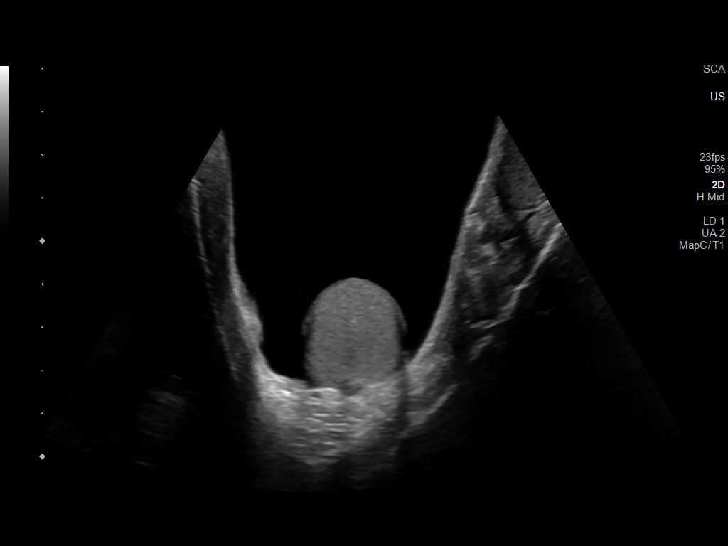
[im 14/33]
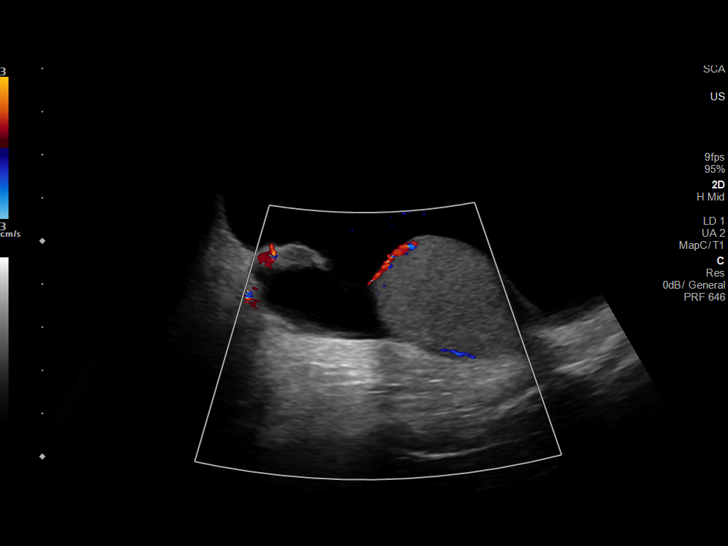
[im 17/33]
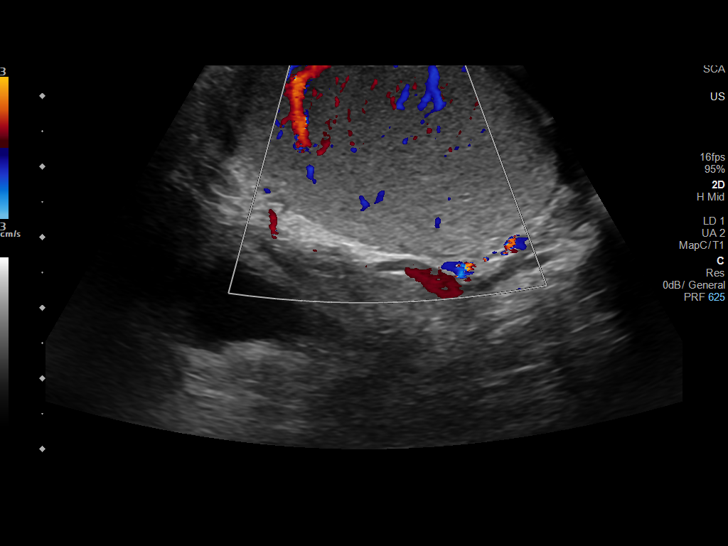
[im 19/33]
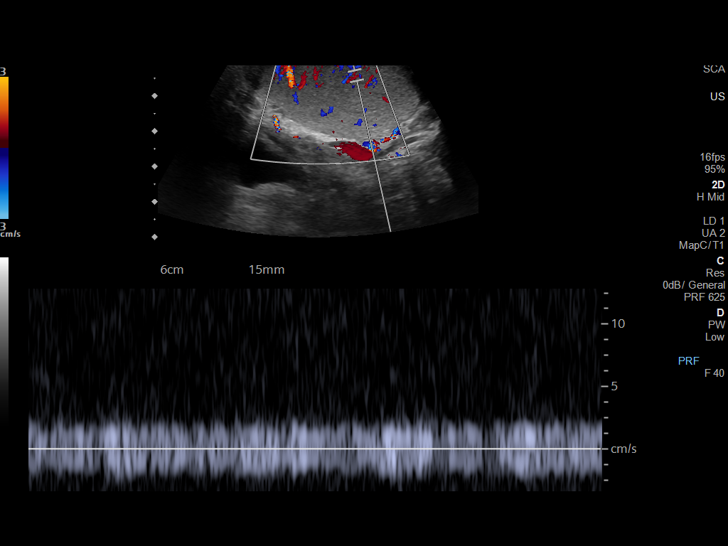
[im 22/33]
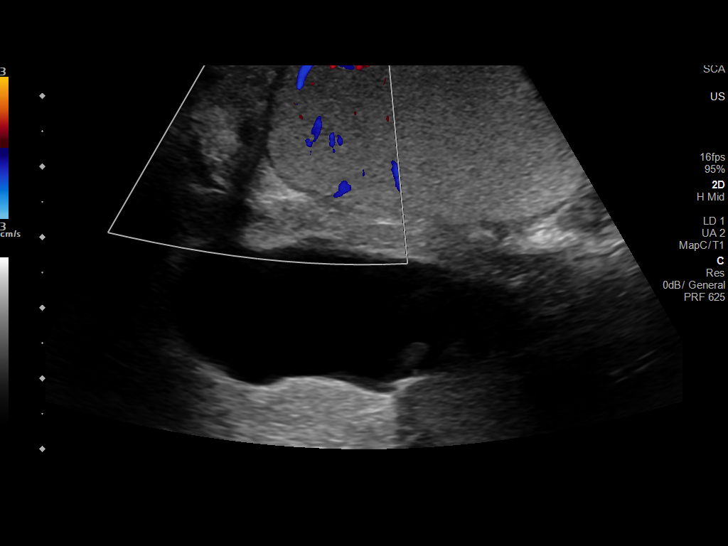
[im 25/33]
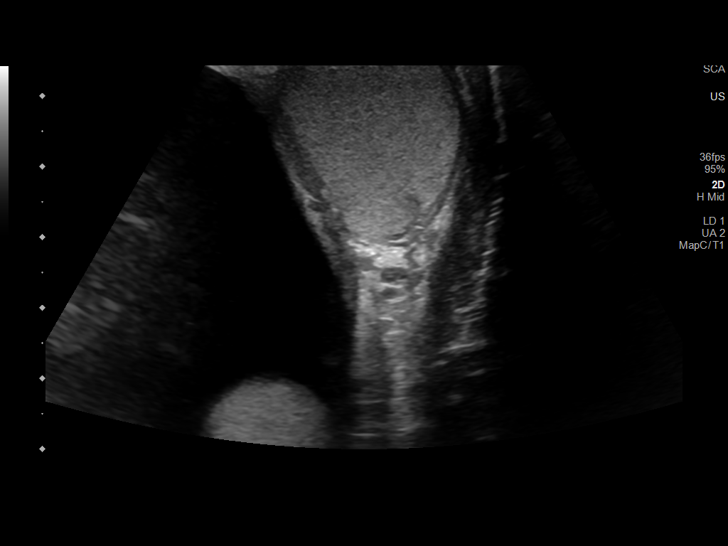
[im 27/33]
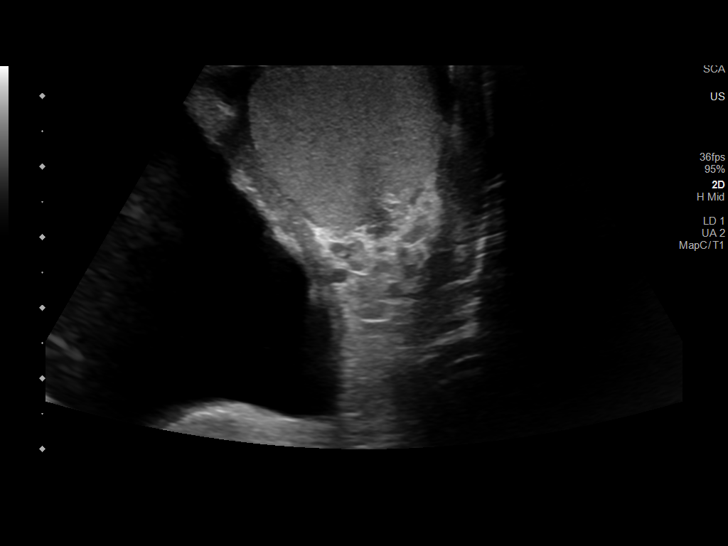
[im 30/33]
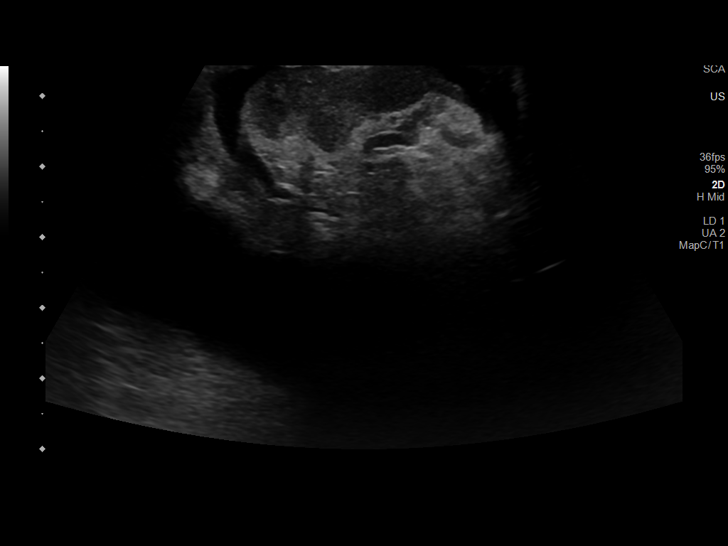
[im 33/33]
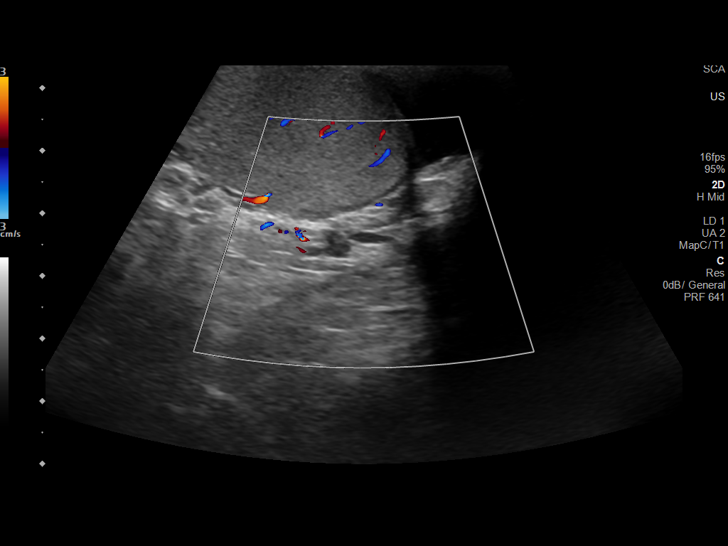

[13 of 25 positions shown; findings below may reference images not displayed]

FINDINGS: Right testicle

Measurements: 4.8 x 2.7 x 2.5 cm. No mass or microlithiasis
visualized.

Left testicle

Measurements: 4.8 x 2.7 x 2.5 cm. No mass or microlithiasis
visualized.

Right epididymis: Normal in size and appearance. No evident
hyperemia.

Left epididymis: Normal in size and appearance. No evident
hyperemia.

Hydrocele: There is a large right hydrocele. There is a much smaller
left hydrocele.

Varicocele:  None visualized.

Pulsed Doppler interrogation of both testes demonstrates normal low
resistance arterial and venous waveforms bilaterally.

No scrotal abscess or scrotal wall thickening on either side.
IMPRESSION: Large right hydrocele of uncertain etiology. Much smaller left
hydrocele.

No intratesticular or extratesticular mass. No orchitis or
epididymitis. No testicular torsion on either side.
